# Patient Record
Sex: Male | Born: 1945
Health system: Southern US, Community
[De-identification: ages and names within clinical notes are randomized; demographics above are authoritative.]

## PROBLEM LIST (undated history)

## (undated) DIAGNOSIS — E079 Disorder of thyroid, unspecified: Secondary | ICD-10-CM

## (undated) DIAGNOSIS — I454 Nonspecific intraventricular block: Secondary | ICD-10-CM

## (undated) DIAGNOSIS — M1990 Unspecified osteoarthritis, unspecified site: Secondary | ICD-10-CM

## (undated) DIAGNOSIS — I2510 Atherosclerotic heart disease of native coronary artery without angina pectoris: Secondary | ICD-10-CM

## (undated) DIAGNOSIS — I714 Abdominal aortic aneurysm, without rupture, unspecified: Secondary | ICD-10-CM

## (undated) DIAGNOSIS — F419 Anxiety disorder, unspecified: Secondary | ICD-10-CM

## (undated) DIAGNOSIS — E669 Obesity, unspecified: Secondary | ICD-10-CM

## (undated) DIAGNOSIS — I509 Heart failure, unspecified: Secondary | ICD-10-CM

## (undated) DIAGNOSIS — R0602 Shortness of breath: Secondary | ICD-10-CM

## (undated) DIAGNOSIS — G4733 Obstructive sleep apnea (adult) (pediatric): Secondary | ICD-10-CM

## (undated) DIAGNOSIS — E119 Type 2 diabetes mellitus without complications: Secondary | ICD-10-CM

## (undated) DIAGNOSIS — R0609 Other forms of dyspnea: Secondary | ICD-10-CM

## (undated) DIAGNOSIS — G4730 Sleep apnea, unspecified: Secondary | ICD-10-CM

## (undated) DIAGNOSIS — R0601 Orthopnea: Secondary | ICD-10-CM

## (undated) DIAGNOSIS — Z9989 Dependence on other enabling machines and devices: Secondary | ICD-10-CM

## (undated) DIAGNOSIS — J449 Chronic obstructive pulmonary disease, unspecified: Secondary | ICD-10-CM

## (undated) DIAGNOSIS — R0600 Dyspnea, unspecified: Secondary | ICD-10-CM

## (undated) DIAGNOSIS — E785 Hyperlipidemia, unspecified: Secondary | ICD-10-CM

## (undated) DIAGNOSIS — I10 Essential (primary) hypertension: Secondary | ICD-10-CM

## (undated) DIAGNOSIS — F32A Depression, unspecified: Secondary | ICD-10-CM

## (undated) HISTORY — PX: VASCULAR SURGERY: SHX849

## (undated) HISTORY — DX: Orthopnea: R06.01

## (undated) HISTORY — DX: Essential (primary) hypertension: I10

## (undated) HISTORY — DX: Heart failure, unspecified: I50.9

## (undated) HISTORY — DX: Unspecified osteoarthritis, unspecified site: M19.90

## (undated) HISTORY — PX: VASECTOMY: SHX75

## (undated) HISTORY — DX: Chronic obstructive pulmonary disease, unspecified: J44.9

## (undated) HISTORY — DX: Anxiety disorder, unspecified: F41.9

## (undated) HISTORY — DX: Depression, unspecified: F32.A

## (undated) HISTORY — DX: Hyperlipidemia, unspecified: E78.5

## (undated) HISTORY — DX: Disorder of thyroid, unspecified: E07.9

## (undated) HISTORY — DX: Dyspnea, unspecified: R06.00

## (undated) HISTORY — DX: Other forms of dyspnea: R06.09

## (undated) HISTORY — PX: CORONARY ANGIOPLASTY WITH STENT PLACEMENT: SHX49

## (undated) HISTORY — DX: Type 2 diabetes mellitus without complications: E11.9

## (undated) HISTORY — DX: Obesity, unspecified: E66.9

## (undated) HISTORY — DX: Shortness of breath: R06.02

## (undated) HISTORY — DX: Abdominal aortic aneurysm, without rupture, unspecified: I71.40

## (undated) HISTORY — DX: Nonspecific intraventricular block: I45.4

## (undated) HISTORY — DX: Abdominal aortic aneurysm, without rupture: I71.4

## (undated) HISTORY — DX: Sleep apnea, unspecified: G47.30

## (undated) HISTORY — DX: Dependence on other enabling machines and devices: Z99.89

## (undated) HISTORY — DX: Atherosclerotic heart disease of native coronary artery without angina pectoris: I25.10

## (undated) HISTORY — DX: Obstructive sleep apnea (adult) (pediatric): G47.33

---

## 2016-04-28 DIAGNOSIS — E039 Hypothyroidism, unspecified: Secondary | ICD-10-CM | POA: Insufficient documentation

## 2016-06-19 DIAGNOSIS — Z9989 Dependence on other enabling machines and devices: Secondary | ICD-10-CM | POA: Insufficient documentation

## 2016-06-19 DIAGNOSIS — G4733 Obstructive sleep apnea (adult) (pediatric): Secondary | ICD-10-CM | POA: Insufficient documentation

## 2016-11-13 DIAGNOSIS — R0600 Dyspnea, unspecified: Secondary | ICD-10-CM | POA: Insufficient documentation

## 2016-11-13 DIAGNOSIS — J449 Chronic obstructive pulmonary disease, unspecified: Secondary | ICD-10-CM | POA: Insufficient documentation

## 2017-04-02 DIAGNOSIS — R918 Other nonspecific abnormal finding of lung field: Secondary | ICD-10-CM | POA: Insufficient documentation

## 2017-12-16 DIAGNOSIS — I878 Other specified disorders of veins: Secondary | ICD-10-CM | POA: Insufficient documentation

## 2017-12-16 DIAGNOSIS — F5102 Adjustment insomnia: Secondary | ICD-10-CM | POA: Insufficient documentation

## 2018-02-11 DIAGNOSIS — Z794 Long term (current) use of insulin: Secondary | ICD-10-CM | POA: Insufficient documentation

## 2018-02-11 DIAGNOSIS — B0229 Other postherpetic nervous system involvement: Secondary | ICD-10-CM | POA: Insufficient documentation

## 2018-02-11 DIAGNOSIS — E119 Type 2 diabetes mellitus without complications: Secondary | ICD-10-CM | POA: Insufficient documentation

## 2018-03-18 DIAGNOSIS — I714 Abdominal aortic aneurysm, without rupture, unspecified: Secondary | ICD-10-CM | POA: Insufficient documentation

## 2018-06-14 DIAGNOSIS — N528 Other male erectile dysfunction: Secondary | ICD-10-CM | POA: Insufficient documentation

## 2018-08-03 DIAGNOSIS — E1169 Type 2 diabetes mellitus with other specified complication: Secondary | ICD-10-CM | POA: Insufficient documentation

## 2018-08-03 DIAGNOSIS — E785 Hyperlipidemia, unspecified: Secondary | ICD-10-CM | POA: Insufficient documentation

## 2018-08-03 DIAGNOSIS — Z7901 Long term (current) use of anticoagulants: Secondary | ICD-10-CM | POA: Insufficient documentation

## 2019-09-29 DIAGNOSIS — Z6841 Body Mass Index (BMI) 40.0 and over, adult: Secondary | ICD-10-CM | POA: Insufficient documentation

## 2019-09-29 DIAGNOSIS — I5032 Chronic diastolic (congestive) heart failure: Secondary | ICD-10-CM | POA: Insufficient documentation

## 2020-03-15 ENCOUNTER — Encounter: Payer: Self-pay | Admitting: Internal Medicine

## 2020-03-15 ENCOUNTER — Other Ambulatory Visit: Payer: Self-pay

## 2020-03-15 ENCOUNTER — Ambulatory Visit (INDEPENDENT_AMBULATORY_CARE_PROVIDER_SITE_OTHER): Payer: Medicare PPO | Admitting: Internal Medicine

## 2020-03-15 VITALS — BP 108/70 | HR 60 | Ht 71.0 in | Wt 280.0 lb

## 2020-03-15 DIAGNOSIS — I5020 Unspecified systolic (congestive) heart failure: Secondary | ICD-10-CM | POA: Diagnosis not present

## 2020-03-15 DIAGNOSIS — E119 Type 2 diabetes mellitus without complications: Secondary | ICD-10-CM

## 2020-03-15 DIAGNOSIS — I2510 Atherosclerotic heart disease of native coronary artery without angina pectoris: Secondary | ICD-10-CM | POA: Diagnosis not present

## 2020-03-15 DIAGNOSIS — I4819 Other persistent atrial fibrillation: Secondary | ICD-10-CM | POA: Diagnosis not present

## 2020-03-15 DIAGNOSIS — I10 Essential (primary) hypertension: Secondary | ICD-10-CM

## 2020-03-15 MED ORDER — ENTRESTO 24-26 MG PO TABS: 1.0000 | ORAL_TABLET | Freq: Two times a day (BID) | ORAL | 6 refills | Status: DC

## 2020-03-15 NOTE — Patient Instructions (Signed)
Medication Instructions:  Your physician has recommended you make the following change in your medication:   1) Stop Diovan 2) Stop Diltiazem 3) Start Entresto 24-26 mg, 1 tablet by mouth twice a day  *If you need a refill on your cardiac medications before your next appointment, please call your pharmacy*  Lab Work: You will have labs drawn today: BMET/BNP/Magnesium  Testing/Procedures: Your physician has requested that you have an echocardiogram. Echocardiography is a painless test that uses sound waves to create images of your heart. It provides your doctor with information about the size and shape of your heart and how well your hearts chambers and valves are working. This procedure takes approximately one hour. There are no restrictions for this procedure.  Follow-Up: At [facility], you and your health needs are our priority.  As part of our continuing mission to provide you with exceptional heart care, we have created designated Provider Care Teams.  These Care Teams include your primary Cardiologist (physician) and Advanced Practice Providers (APPs -  Physician Assistants and Nurse Practitioners) who all work together to provide you with the care you need, when you need it.  Your next appointment:   1 month(s)  The format for your next appointment:   In Person  Provider:   Riley Lam, MD

## 2020-03-15 NOTE — Progress Notes (Signed)
Cardiology Office Note:    Date:  03/15/2020   ID:  Douglas Cowan, DOB 1945-12-04, MRN [id number]  [facility] Cardiologist:  No primary care provider on file.   CC: Establish Care Consulted for the evaluation of chest pain at the behest of the [place] [facility] [place]  History of Present Illness:    Douglas Cowan is a 74 y.o. male with a hx of Coronary Artery disease with prior stents in 2011, LCP 01/03/20 at [facility] with D2 80% prox LCx 60%.  Prior atrial flutter with ablation 8 years ago complicated by asystole per patient with long standing persistent atrial fibrillation CHADSVASC 4, question of HFrEF EF 35-40% with inferior WMA and severe LA dilation ([facility]) 01/02/20.   Diabetes, Morbid Obesity and some alcohol misuse.  Patient had profound shortness of breath with typical anginal chest pain in August of 2021.  Patient was found to have D2 disease with obstructive disease.  When he left the hospital he felt well without chest pain or shortness of breath.  Patient notes persistent weight loss.  No longer has chest pain, but has some shortness of breath with activity.  No palpitations.  Notes both PND and orthopnea.  Notes bendopnea.  Notes improves LE edema.  Past Medical History:  Diagnosis Date  . Abdominal aortic aneurysm (AAA) (HCC)   . BBB (bundle branch block)   . CAD (coronary artery disease)   . CHF (congestive heart failure) (HCC)   . CPAP (continuous positive airway pressure) dependence   . DM (diabetes mellitus) (HCC)   . DOE (dyspnea on exertion)   . HLD (hyperlipidemia)   . Hypertension   . Obesity   . Orthopnea   . OSA (obstructive sleep apnea)   . SOB (shortness of breath)     Past Surgical History:  Procedure Laterality Date  . CORONARY ANGIOPLASTY WITH STENT PLACEMENT      Current Medications: Current Meds  Medication Sig  . acetaminophen (TYLENOL) 500 MG tablet Take 500 mg by mouth every 6 (six) hours as needed.  Marland Kitchen apixaban  (ELIQUIS) 5 MG TABS tablet Take 5 mg by mouth 2 (two) times daily.  Marland Kitchen ascorbic acid (VITAMIN C) 500 MG tablet Take 500 mg by mouth daily.  Marland Kitchen aspirin EC 81 MG tablet Take 81 mg by mouth daily. Swallow whole.  . Dulaglutide (TRULICITY) 0.75 MG/0.5ML SOPN Inject 0.75 mg into the skin once a week.  . furosemide (LASIX) 40 MG tablet Take 40 mg by mouth.  . magnesium oxide (MAG-OX) 400 MG tablet Take 400 mg by mouth daily.  . metFORMIN (GLUCOPHAGE) 500 MG tablet Take by mouth 2 (two) times daily with a meal.  . metoprolol succinate (TOPROL-XL) 100 MG 24 hr tablet Take 100 mg by mouth in the morning and at bedtime. Take with or immediately following a meal.   . Multiple Vitamin (MULTIVITAMIN) capsule Take 1 capsule by mouth daily.  . pantoprazole (PROTONIX) 40 MG tablet Take 40 mg by mouth daily.  . potassium chloride (MICRO-K) 10 MEQ CR capsule Take 10 mEq by mouth daily.   . rosuvastatin (CRESTOR) 40 MG tablet Take 40 mg by mouth daily.  . tadalafil (CIALIS) 10 MG tablet Take 10 mg by mouth daily as needed for erectile dysfunction.  Marland Kitchen tiotropium (SPIRIVA) 18 MCG inhalation capsule Place 18 mcg into inhaler and inhale daily.  . [DISCONTINUED] diltiazem (TIAZAC) 120 MG 24 hr capsule Take 120 mg by mouth daily.  . [DISCONTINUED] valsartan (DIOVAN) 40  MG tablet Take 40 mg by mouth daily.  . [DISCONTINUED] zolpidem (AMBIEN) 10 MG tablet Take 10 mg by mouth at bedtime as needed for sleep.    Allergies:   Lipitor [atorvastatin]   Social History   Socioeconomic History  . Marital status: Married    Spouse name: Not on file  . Number of children: Not on file  . Years of education: Not on file  . Highest education level: Not on file  Occupational History  . Not on file  Tobacco Use  . Smoking status: Former Games developer  . Smokeless tobacco: Never Used  Substance and Sexual Activity  . Alcohol use: Not on file  . Drug use: Not on file  . Sexual activity: Not on file  Other Topics Concern  . Not on  file  Social History Narrative  . Not on file   Social Determinants of Health   Financial Resource Strain:   . Difficulty of Paying Living Expenses: Not on file  Food Insecurity:   . Worried About Programme researcher, broadcasting/film/video in the Last Year: Not on file  . Ran Out of Food in the Last Year: Not on file  Transportation Needs:   . Lack of Transportation (Medical): Not on file  . Lack of Transportation (Non-Medical): Not on file  Physical Activity:   . Days of Exercise per Week: Not on file  . Minutes of Exercise per Session: Not on file  Stress:   . Feeling of Stress : Not on file  Social Connections:   . Frequency of Communication with Friends and Family: Not on file  . Frequency of Social Gatherings with Friends and Family: Not on file  . Attends Religious Services: Not on file  . Active Member of Clubs or Organizations: Not on file  . Attends Banker Meetings: Not on file  . Marital Status: Not on file     Family History: The patient's notes heart disease in her family Sister had valve replacement with at age the age of 65 Brother had prior valve repair NOS  ROS:   Please see the history of present illness.    All other systems reviewed and are negative.  EKGs/Labs/Other Studies Reviewed:    The following studies were reviewed today:  EKG:   Atrial fibrillation rate 68 with RBBB  Old Records from [place] [facility] [place] August 2021. LCP 01/03/20 at [facility] with D2 80% prox LCx 60%.  Prior atrial flutter with ablation 8 years ago complicated by asystole per patient with long standing persistent atrial fibrillation CHADSVASC 4,, question of HFrEF EF 35-40% with inferior WMA and severe LA dilation ([facility] 01/02/20.   Physical Exam:    VS:  BP 108/70   Pulse 60   Ht 5\' 11"  (1.803 m)   Wt 280 lb (127 kg)   SpO2 99%   BMI 39.05 kg/m     Wt Readings from Last 3 Encounters:  03/15/20 280 lb (127 kg)    GEN:  Well developed in no  acute distress HEENT: Normal NECK: No JVD; No carotid bruits LYMPHATICS: No lymphadenopathy CARDIAC: RRR, no murmurs, rubs, gallops RESPIRATORY:  Clear to auscultation without rales, wheezing or rhonchi  ABDOMEN: Soft, non-tender, non-distended MUSCULOSKELETAL:  No edema; No deformity  SKIN: Warm and dry NEUROLOGIC:  Alert and oriented x 3 PSYCHIATRIC:  Normal affect   ASSESSMENT:    1. HFrEF (heart failure with reduced ejection fraction) (HCC)   2. Coronary artery disease  involving native coronary artery of native heart without angina pectoris   3. Persistent atrial fibrillation (HCC)   4. Morbid obesity (HCC)   5. Diabetes mellitus with coincident hypertension (HCC)    PLAN:    In order of problems listed above:  Chronic Decompensated Heart Failure  Morbid Obesity DM with HTN - NYHA class II, Stage B, slightly euvolemic, etiology from CAD vs AF mixed pictures - will continue lasix 40 mg PO daily - BMP, Mg, and BNP - will start Entresto and stop Diovan; will check Bmp mg in 7-10 days - Strict I/Os, daily weights, and fluid restriction of < 2 L  - Continue Toprol Xl 100 mg - at next visit will consider aldactone and or SGLTsi - Will get TTE - will address potassium after labs  Coronary Artery Disease; Obstructive in Dc and OM2 borderline - symptomatic mildly - continue ASA 81 mg with Eliquis until 12/2020 - continue statin, will check lipids - continue BB as above - will refer you to cardiac rehab - will get the old cath records  Long Standing, Persistent Atrial Fibrillation CHADSVASC 4  - BB and AC as above -  Will stop that diltiazem or tiazac  - at some point OSA  - will reduce to one alcohol drink a week   One month  follow up unless new symptoms or abnormal test results warranting change in plan  Would be reasonable for APP Follow up   Medication Adjustments/Labs and Tests Ordered: Current medicines are reviewed at length with the patient today.  Concerns  regarding medicines are outlined above.  Orders Placed This Encounter  Procedures  . Basic metabolic panel  . Pro b natriuretic peptide (BNP)  . Magnesium  . ECHOCARDIOGRAM COMPLETE   Meds ordered this encounter  Medications  . sacubitril-valsartan (ENTRESTO) 24-26 MG    Sig: Take 1 tablet by mouth 2 (two) times daily.    Dispense:  60 tablet    Refill:  6    Patient Instructions  Medication Instructions:  Your physician has recommended you make the following change in your medication:   1) Stop Diovan 2) Stop Diltiazem 3) Start Entresto 24-26 mg, 1 tablet by mouth twice a day  *If you need a refill on your cardiac medications before your next appointment, please call your pharmacy*  Lab Work: You will have labs drawn today: BMET/BNP/Magnesium  Testing/Procedures: Your physician has requested that you have an echocardiogram. Echocardiography is a painless test that uses sound waves to create images of your heart. It provides your doctor with information about the size and shape of your heart and how well your heart's chambers and valves are working. This procedure takes approximately one hour. There are no restrictions for this procedure.  Follow-Up: At [facility], you and your health needs are our priority.  As part of our continuing mission to provide you with exceptional heart care, we have created designated Provider Care Teams.  These Care Teams include your primary Cardiologist (physician) and Advanced Practice Providers (APPs -  Physician Assistants and Nurse Practitioners) who all work together to provide you with the care you need, when you need it.  Your next appointment:   1 month(s)  The format for your next appointment:   In Person  Provider:   Riley Lam, MD      Signed, Christell Constant, MD  03/15/2020 12:26 PM    [facility]

## 2020-03-16 LAB — BASIC METABOLIC PANEL
BUN/Creatinine Ratio: 16 (ref 10–24)
BUN: 17 mg/dL (ref 8–27)
CO2: 25 mmol/L (ref 20–29)
Calcium: 9.7 mg/dL (ref 8.6–10.2)
Chloride: 100 mmol/L (ref 96–106)
Creatinine, Ser: 1.04 mg/dL (ref 0.76–1.27)
GFR calc Af Amer: 83 mL/min/{1.73_m2} (ref >59)
GFR calc non Af Amer: 71 mL/min/{1.73_m2} (ref >59)
Glucose: 171 mg/dL — ABNORMAL HIGH (ref 65–99)
Potassium: 4.3 mmol/L (ref 3.5–5.2)
Sodium: 140 mmol/L (ref 134–144)

## 2020-03-16 LAB — MAGNESIUM: Magnesium: 1.7 mg/dL (ref 1.6–2.3)

## 2020-03-16 LAB — PRO B NATRIURETIC PEPTIDE: NT-Pro BNP: 256 pg/mL (ref 0–376)

## 2020-03-27 ENCOUNTER — Telehealth: Payer: Self-pay | Admitting: Internal Medicine

## 2020-03-27 NOTE — Telephone Encounter (Signed)
Reviewed [facility] LCP and Echo- has 80% ostial D2 lesion, medium sized lesion.  EF 30-35% moderate LA.  Will review with interventional colleagues.  Diabetic patient but without obstructive multi-vessel disease.  Will discuss repeat LCP depending on sx at next visit; revascularization may improve EF but would likely not lead to full recovery.

## 2020-03-28 ENCOUNTER — Ambulatory Visit (INDEPENDENT_AMBULATORY_CARE_PROVIDER_SITE_OTHER): Payer: Medicare PPO | Admitting: Cardiovascular Disease

## 2020-03-28 ENCOUNTER — Encounter: Payer: Self-pay | Admitting: Cardiovascular Disease

## 2020-03-28 VITALS — BP 92/58 | HR 71 | Ht 71.0 in | Wt 280.0 lb

## 2020-03-28 DIAGNOSIS — I4819 Other persistent atrial fibrillation: Secondary | ICD-10-CM | POA: Diagnosis not present

## 2020-03-28 NOTE — Progress Notes (Signed)
Chief Complaint  Patient presents with  . Follow-up    CAD, chest pain    History of Present Illness: 74 yo male with history of CAD, atrial flutter s/p ablation now with long standing persistent atrial fibrillation, ischemic cardiomyopathy, DM, obesity who is added onto my schedule today after he walked into our office asking to review his medications with triage nursing. He then reported chest pain on/off for several days. He was recently seen 03/15/20 in our office by Dr. Izora Ribas to establish care and reported having had a cardiac cath at [facility] in August 2021 with cath showing 80% Diagonal stenosis and 60% Circumflex stenosis. He was started on Entresto at his first visit here last month. He has had low blood pressures since starting the Entresto. His blood pressure has been in the 90s systolic at home. 98/64 yesterday, 98/66 this am. He has also noticed mild substernal chest pains that last several minutes. Similar to pain he had prior to his cardiac cath in August 2021. No associated dyspnea, diaphoresis or N/V. He denies any chest pain currently.   Primary Care Physician: Patient, No Pcp Per   Past Medical History:  Diagnosis Date  . Abdominal aortic aneurysm (AAA) (HCC)   . BBB (bundle branch block)   . CAD (coronary artery disease)   . CHF (congestive heart failure) (HCC)   . CPAP (continuous positive airway pressure) dependence   . DM (diabetes mellitus) (HCC)   . DOE (dyspnea on exertion)   . HLD (hyperlipidemia)   . Hypertension   . Obesity   . Orthopnea   . OSA (obstructive sleep apnea)   . SOB (shortness of breath)     Past Surgical History:  Procedure Laterality Date  . CORONARY ANGIOPLASTY WITH STENT PLACEMENT      Current Outpatient Medications  Medication Sig Dispense Refill  . acetaminophen (TYLENOL) 500 MG tablet Take 500 mg by mouth every 6 (six) hours as needed.    Marland Kitchen apixaban (ELIQUIS) 5 MG TABS tablet Take 5 mg by mouth 2 (two) times  daily.    Marland Kitchen ascorbic acid (VITAMIN C) 500 MG tablet Take 500 mg by mouth daily.    Marland Kitchen aspirin EC 81 MG tablet Take 81 mg by mouth daily. Swallow whole.    . furosemide (LASIX) 40 MG tablet Take 40 mg by mouth.    . magnesium oxide (MAG-OX) 400 MG tablet Take 400 mg by mouth daily.    . metFORMIN (GLUCOPHAGE) 500 MG tablet Take by mouth 2 (two) times daily with a meal.    . metoprolol succinate (TOPROL-XL) 100 MG 24 hr tablet Take 100 mg by mouth in the morning and at bedtime. Take with or immediately following a meal.     . Multiple Vitamin (MULTIVITAMIN) capsule Take 1 capsule by mouth daily.    . pantoprazole (PROTONIX) 40 MG tablet Take 40 mg by mouth daily.    . potassium chloride (MICRO-K) 10 MEQ CR capsule Take 10 mEq by mouth daily.     . rosuvastatin (CRESTOR) 40 MG tablet Take 40 mg by mouth daily.    . tadalafil (CIALIS) 10 MG tablet Take 10 mg by mouth daily as needed for erectile dysfunction.    Marland Kitchen tiotropium (SPIRIVA) 18 MCG inhalation capsule Place 18 mcg into inhaler and inhale daily.     No current facility-administered medications for this visit.    Allergies  Allergen Reactions  . Lipitor [Atorvastatin]     Social History  Socioeconomic History  . Marital status: Married    Spouse name: Not on file  . Number of children: Not on file  . Years of education: Not on file  . Highest education level: Not on file  Occupational History  . Not on file  Tobacco Use  . Smoking status: Former Games developer  . Smokeless tobacco: Never Used  Substance and Sexual Activity  . Alcohol use: Not on file  . Drug use: Not on file  . Sexual activity: Not on file  Other Topics Concern  . Not on file  Social History Narrative  . Not on file   Social Determinants of Health   Financial Resource Strain:   . Difficulty of Paying Living Expenses: Not on file  Food Insecurity:   . Worried About Programme researcher, broadcasting/film/video in the Last Year: Not on file  . Ran Out of Food in the Last Year: Not on  file  Transportation Needs:   . Lack of Transportation (Medical): Not on file  . Lack of Transportation (Non-Medical): Not on file  Physical Activity:   . Days of Exercise per Week: Not on file  . Minutes of Exercise per Session: Not on file  Stress:   . Feeling of Stress : Not on file  Social Connections:   . Frequency of Communication with Friends and Family: Not on file  . Frequency of Social Gatherings with Friends and Family: Not on file  . Attends Religious Services: Not on file  . Active Member of Clubs or Organizations: Not on file  . Attends Banker Meetings: Not on file  . Marital Status: Not on file  Intimate Partner Violence:   . Fear of Current or Ex-Partner: Not on file  . Emotionally Abused: Not on file  . Physically Abused: Not on file  . Sexually Abused: Not on file    History reviewed. No pertinent family history.  Review of Systems:  As stated in the HPI and otherwise negative.   BP (!) 92/58   Pulse 71   Ht 5\' 11"  (1.803 m)   Wt 280 lb (127 kg)   SpO2 94%   BMI 39.05 kg/m   Physical Examination: General: Well developed, well nourished, NAD  HEENT: OP clear, mucus membranes moist  SKIN: warm, dry. No rashes. Neuro: No focal deficits  Musculoskeletal: Muscle strength 5/5 all ext  Psychiatric: Mood and affect normal  Neck: No JVD, no carotid bruits, no thyromegaly, no lymphadenopathy.  Lungs:Clear bilaterally, no wheezes, rhonci, crackles Cardiovascular: Irreg irreg. No murmurs, gallops or rubs. Abdomen:Soft. Bowel sounds present. Non-tender.  Extremities: No lower extremity edema. Pulses are 2 + in the bilateral DP/PT.  EKG:  EKG is ordered today. The ekg ordered today demonstrates atrial fibrillation, rate 71 bpm. No ischemic changes.   Recent Labs: 03/15/2020: BUN 17; Creatinine, Ser 1.04; Magnesium 1.7; NT-Pro BNP 256; Potassium 4.3; Sodium 140   Lipid Panel No results found for: CHOL, TRIG, HDL, CHOLHDL, VLDL, LDLCALC,  LDLDIRECT   Wt Readings from Last 3 Encounters:  03/28/20 280 lb (127 kg)  03/15/20 280 lb (127 kg)      Assessment and Plan:   1. CAD with angina: He is having mild occasional chest pains, similar to his prior angina. No ischemic EKG changes today. No active chest pain. He is not on a long acting nitrate. I reviewed the findings from the cardiac cath in August 2021 at [place] [facility] [place]. He has no obstructive disease in the major  epicardial vessels. His blood pressure has been low since starting Entresto. I cannot add a long acting nitrate to his therapy today given his soft BP. Will have him hold Entresto for now as I suspect his hypotension is contributing to his fatigue and chest pressure. Follow BP closely at home over the next few days. He will be followed closely in our office. It may be best to resume his ARB instead of Entresto. He will call 911 with a change in intensity or character of his chest pain.   2. Persistent atrial fibrillation: Rate controlled. Continue Eliquis and Toprol.   Current medicines are reviewed at length with the patient today.  The patient does not have concerns regarding medicines.  The following changes have been made:  no change  Labs/ tests ordered today include:   Orders Placed This Encounter  Procedures  . EKG 12-Lead     Disposition:   FU with Dr. Izora Ribas or office APP in one week.    Signed, Verne Carrow, MD 03/28/2020 1:24 PM    [facility] [address] [place], [place], [place]  [address] Phone: [phone]; Fax: [phone]

## 2020-03-28 NOTE — Patient Instructions (Addendum)
Medication Instructions:  Stop ENTRESTO *If you need a refill on your cardiac medications before your next appointment, please call your pharmacy*   Lab Work: none If you have labs (blood work) drawn today and your tests are completely normal, you will receive your results only by: Marland Kitchen MyChart Message (if you have MyChart) OR . A paper copy in the mail If you have any lab test that is abnormal or we need to change your treatment, we will call you to review the results.   Testing/Procedures: none   Follow-Up: As scheduled 04/16/20.   Other Instructions

## 2020-04-09 ENCOUNTER — Other Ambulatory Visit (HOSPITAL_COMMUNITY): Payer: Medicare PPO

## 2020-04-09 ENCOUNTER — Encounter (HOSPITAL_COMMUNITY): Payer: Self-pay | Admitting: Internal Medicine

## 2020-04-11 ENCOUNTER — Encounter: Payer: Self-pay | Admitting: Physician Assistant

## 2020-04-11 ENCOUNTER — Other Ambulatory Visit: Payer: Self-pay

## 2020-04-11 ENCOUNTER — Ambulatory Visit (INDEPENDENT_AMBULATORY_CARE_PROVIDER_SITE_OTHER): Payer: Medicare PPO | Admitting: Physician Assistant

## 2020-04-11 VITALS — BP 118/80 | HR 78 | Temp 98.2°F | Resp 16 | Ht 70.0 in | Wt 283.0 lb

## 2020-04-11 DIAGNOSIS — E039 Hypothyroidism, unspecified: Secondary | ICD-10-CM

## 2020-04-11 DIAGNOSIS — E785 Hyperlipidemia, unspecified: Secondary | ICD-10-CM | POA: Diagnosis not present

## 2020-04-11 DIAGNOSIS — Z794 Long term (current) use of insulin: Secondary | ICD-10-CM | POA: Diagnosis not present

## 2020-04-11 DIAGNOSIS — E119 Type 2 diabetes mellitus without complications: Secondary | ICD-10-CM | POA: Diagnosis not present

## 2020-04-11 DIAGNOSIS — E1169 Type 2 diabetes mellitus with other specified complication: Secondary | ICD-10-CM

## 2020-04-11 DIAGNOSIS — R7989 Other specified abnormal findings of blood chemistry: Secondary | ICD-10-CM | POA: Diagnosis not present

## 2020-04-11 DIAGNOSIS — J42 Unspecified chronic bronchitis: Secondary | ICD-10-CM

## 2020-04-11 DIAGNOSIS — I5020 Unspecified systolic (congestive) heart failure: Secondary | ICD-10-CM | POA: Diagnosis not present

## 2020-04-11 DIAGNOSIS — Z125 Encounter for screening for malignant neoplasm of prostate: Secondary | ICD-10-CM

## 2020-04-11 DIAGNOSIS — I4819 Other persistent atrial fibrillation: Secondary | ICD-10-CM | POA: Diagnosis not present

## 2020-04-11 DIAGNOSIS — I10 Essential (primary) hypertension: Secondary | ICD-10-CM

## 2020-04-11 LAB — LIPID PANEL
Cholesterol: 135 mg/dL (ref 0–200)
HDL: 41.5 mg/dL (ref >39.00)
LDL Cholesterol: 58 mg/dL (ref 0–99)
NonHDL: 93.39
Total CHOL/HDL Ratio: 3
Triglycerides: 178 mg/dL — ABNORMAL HIGH (ref 0.0–149.0)
VLDL: 35.6 mg/dL (ref 0.0–40.0)

## 2020-04-11 LAB — HEMOGLOBIN A1C: Hgb A1c MFr Bld: 8.5 % — ABNORMAL HIGH (ref 4.6–6.5)

## 2020-04-11 LAB — COMPREHENSIVE METABOLIC PANEL
ALT: 49 U/L (ref 0–53)
AST: 47 U/L — ABNORMAL HIGH (ref 0–37)
Albumin: 4.4 g/dL (ref 3.5–5.2)
Alkaline Phosphatase: 41 U/L (ref 39–117)
BUN: 17 mg/dL (ref 6–23)
CO2: 28 mEq/L (ref 19–32)
Calcium: 9.6 mg/dL (ref 8.4–10.5)
Chloride: 100 mEq/L (ref 96–112)
Creatinine, Ser: 1.06 mg/dL (ref 0.40–1.50)
GFR: 70.31 mL/min (ref >60.00)
Glucose, Bld: 245 mg/dL — ABNORMAL HIGH (ref 70–99)
Potassium: 4.3 mEq/L (ref 3.5–5.1)
Sodium: 138 mEq/L (ref 135–145)
Total Bilirubin: 0.8 mg/dL (ref 0.2–1.2)
Total Protein: 6.9 g/dL (ref 6.0–8.3)

## 2020-04-11 LAB — PSA, MEDICARE: PSA: 0.55 ng/ml (ref 0.10–4.00)

## 2020-04-11 LAB — TSH: TSH: 6.35 u[IU]/mL — ABNORMAL HIGH (ref 0.35–4.50)

## 2020-04-11 LAB — MICROALBUMIN / CREATININE URINE RATIO
Creatinine,U: 25.3 mg/dL
Microalb Creat Ratio: 2.8 mg/g (ref 0.0–30.0)
Microalb, Ur: <0.7 mg/dL (ref 0.0–1.9)

## 2020-04-11 LAB — TESTOSTERONE: Testosterone: 163.48 ng/dL — ABNORMAL LOW (ref 300.00–890.00)

## 2020-04-11 NOTE — Progress Notes (Signed)
Patient presents to clinic today to establish care.  Patient and wife recently moved to the area from [place].  Patient has a history of coronary artery disease, persistent A. fib, CHF, hyperlipidemia, OSA, COPD, diabetes mellitus with hypertension, anxiety and history of abdominal aortic aneurysm.  Is currently followed by local cardiologist Dr. Angelena Form with recent visit on 03/28/2020.  Is currently on a regimen of Eliquis 5 mg twice daily, 81 mg ASA daily, metoprolol XL 100 mg twice daily, Entresto 24-26 mg twice daily, Lasix 40 mg daily, Klor-Con 10 mEq daily and Crestor 40 mg daily.  Endorses taking all medications as directed.  Denies any current issue with chest pain, lightheadedness or dizziness.  Has had some mild shortness of breath so cardiology has ordered an echocardiogram which is scheduled for the first of next month.  In regards to diabetes, patient notes recently being switched from [place] to Trulicity by his previous primary care provider.  Was taking 0.75 mg weekly as directed.  Ran out of pansinusitis restarted his Basaglar at 10 units nightly.  Notes fasting glucose levels greater than 180 at present.  Is continued on Metformin 1000 mg twice daily.  Denies any known history of diabetic retinopathy, nephropathy or neuropathy.  Unsure of vaccine status.  Records will need to be requested.  Of note patient endorses being told his thyroid was underactive.  Was on medication for some time but notes has not been on in several years and has been told thyroid function is stable.  Would like this rechecked.  Records requested to see when last health maintenance was.  Past Medical History:  Diagnosis Date   Abdominal aortic aneurysm (AAA) (HCC)    Anxiety    Arthritis    BBB (bundle branch block)    CAD (coronary artery disease)    CHF (congestive heart failure) (HCC)    COPD (chronic obstructive pulmonary disease) (HCC)    CPAP (continuous positive airway pressure)  dependence    Depression    DM (diabetes mellitus) ([place])    DOE (dyspnea on exertion)    HLD (hyperlipidemia)    Hypertension    Obesity    Orthopnea    OSA (obstructive sleep apnea)    Sleep apnea    SOB (shortness of breath)    Thyroid disease     Past Surgical History:  Procedure Laterality Date   CORONARY ANGIOPLASTY WITH STENT PLACEMENT     VASECTOMY      Current Outpatient Medications on File Prior to Visit  Medication Sig Dispense Refill   ACCU-CHEK GUIDE test strip      Accu-Chek Softclix Lancets lancets 1 each 3 (three) times daily.     acetaminophen (TYLENOL) 500 MG tablet Take 500 mg by mouth every 6 (six) hours as needed.     albuterol (VENTOLIN HFA) 108 (90 Base) MCG/ACT inhaler Inhale into the lungs.     apixaban (ELIQUIS) 5 MG TABS tablet Take 5 mg by mouth 2 (two) times daily.     ascorbic acid (VITAMIN C) 500 MG tablet Take 500 mg by mouth daily.     aspirin EC 81 MG tablet Take 81 mg by mouth daily. Swallow whole.     Dulaglutide (TRULICITY) 3 YK/5.9DJ SOPN Inject into the skin.     furosemide (LASIX) 40 MG tablet Take 40 mg by mouth.     Insulin Glargine (BASAGLAR KWIKPEN) 100 UNIT/ML Inject 10 Units into the skin at bedtime.     magnesium oxide (MAG-OX)  400 MG tablet Take 400 mg by mouth daily.     metFORMIN (GLUCOPHAGE) 500 MG tablet Take by mouth 2 (two) times daily with a meal.     metoprolol succinate (TOPROL-XL) 100 MG 24 hr tablet Take 100 mg by mouth in the morning and at bedtime. Take with or immediately following a meal.      Multiple Vitamin (MULTIVITAMIN) capsule Take 1 capsule by mouth daily.     pantoprazole (PROTONIX) 40 MG tablet Take 40 mg by mouth daily.     potassium chloride (MICRO-K) 10 MEQ CR capsule Take 10 mEq by mouth daily.      rosuvastatin (CRESTOR) 40 MG tablet Take 40 mg by mouth daily.     sacubitril-valsartan (ENTRESTO) 24-26 MG Take 1 tablet by mouth 2 (two) times daily.     tadalafil (CIALIS)  10 MG tablet Take 10 mg by mouth daily as needed for erectile dysfunction.     tiotropium (SPIRIVA) 18 MCG inhalation capsule Place 18 mcg into inhaler and inhale daily.     No current facility-administered medications on file prior to visit.    Allergies  Allergen Reactions   Lipitor [Atorvastatin] Other (See Comments)    Myalgias, flu like symptoms    Family History  Problem Relation Age of Onset   Depression Mother    Heart disease Mother    Hyperlipidemia Mother    Stroke Mother    Cancer Father    Diabetes Father    Early death Father    Arthritis Sister    Birth defects Sister    Heart disease Sister    Early death Brother    Birth defects Brother    Heart disease Brother     Social History   Socioeconomic History   Marital status: Married    Spouse name: Not on file   Number of children: Not on file   Years of education: Not on file   Highest education level: Not on file  Occupational History   Not on file  Tobacco Use   Smoking status: Former Smoker    Types: Cigarettes, Pipe   Smokeless tobacco: Never Used  Scientific laboratory technician Use: Never used  Substance and Sexual Activity   Alcohol use: Yes    Alcohol/week: 20.0 standard drinks    Types: 6 Cans of beer, 14 Glasses of wine per week   Drug use: Never   Sexual activity: Yes    Birth control/protection: Surgical    Comment: Vascetomy  Other Topics Concern   Not on file  Social History Narrative   Not on file   Social Determinants of Health   Financial Resource Strain:    Difficulty of Paying Living Expenses: Not on file  Food Insecurity:    Worried About Charity fundraiser in the Last Year: Not on file   [employer] of Food in the Last Year: Not on file  Transportation Needs:    Lack of Transportation (Medical): Not on file   Lack of Transportation (Non-Medical): Not on file  Physical Activity:    Days of Exercise per Week: Not on file   Minutes of Exercise  per Session: Not on file  Stress:    Feeling of Stress : Not on file  Social Connections:    Frequency of Communication with Friends and Family: Not on file   Frequency of Social Gatherings with Friends and Family: Not on file   Attends Religious Services: Not on file  Active Member of Clubs or Organizations: Not on file   Attends Archivist Meetings: Not on file   Marital Status: Not on file  Intimate Partner Violence:    Fear of Current or Ex-Partner: Not on file   Emotionally Abused: Not on file   Physically Abused: Not on file   Sexually Abused: Not on file   ROS Pertinent ROS are listed in the HPI.  Resp 16    Ht 5' 10" (1.778 m)    Wt 283 lb (128.4 kg)    BMI 40.61 kg/m   Physical Exam Vitals reviewed.  Constitutional:      Appearance: Normal appearance.  HENT:     Head: Normocephalic and atraumatic.     Right Ear: Tympanic membrane normal.     Left Ear: Tympanic membrane normal.  Eyes:     Conjunctiva/sclera: Conjunctivae normal.     Pupils: Pupils are equal, round, and reactive to light.  Cardiovascular:     Rate and Rhythm: Rhythm irregular.     Pulses: Normal pulses.     Heart sounds: Normal heart sounds.     Comments: Chronic irregularly irregular rhythm with A. fib.  Rate controlled. Pulmonary:     Effort: Pulmonary effort is normal.     Breath sounds: Normal breath sounds.  Musculoskeletal:     Cervical back: Neck supple.  Neurological:     General: No focal deficit present.     Mental Status: He is alert and oriented to person, place, and time.  Psychiatric:        Mood and Affect: Mood normal.     Recent Results (from the past 2160 hour(s))  Basic metabolic panel     Status: Abnormal   Collection Time: 03/15/20 12:27 PM  Result Value Ref Range   Glucose 171 (H) 65 - 99 mg/dL   BUN 17 8 - 27 mg/dL   Creatinine, Ser 1.04 0.76 - 1.27 mg/dL   GFR calc non Af Amer 71 >59 mL/min/1.73   GFR calc Af Amer 83 >59 mL/min/1.73     Comment: **In accordance with recommendations from the NKF-ASN Task force,**   [facility] is in the process of updating its eGFR calculation to the   2021 CKD-EPI creatinine equation that estimates kidney function   without a race variable.    BUN/Creatinine Ratio 16 10 - 24   Sodium 140 134 - 144 mmol/L   Potassium 4.3 3.5 - 5.2 mmol/L   Chloride 100 96 - 106 mmol/L   CO2 25 20 - 29 mmol/L   Calcium 9.7 8.6 - 10.2 mg/dL  Pro b natriuretic peptide (BNP)     Status: None   Collection Time: 03/15/20 12:27 PM  Result Value Ref Range   NT-Pro BNP 256 0 - 376 pg/mL    Comment: The following cut-points have been suggested for the use of proBNP for the diagnostic evaluation of heart failure (HF) in patients with acute dyspnea: Modality                     Age           Optimal Cut                            (years)            Point ------------------------------------------------------ Diagnosis (rule in HF)        <50  450 pg/mL                           50 - 75            900 pg/mL                               >75           1800 pg/mL Exclusion (rule out HF)  Age independent     300 pg/mL   Magnesium     Status: None   Collection Time: 03/15/20 12:27 PM  Result Value Ref Range   Magnesium 1.7 1.6 - 2.3 mg/dL    Assessment/Plan: 1. Type 2 diabetes mellitus without complication, with long-term current use of insulin (HCC) We will repeat labs today to assess current glycemic control.  Plan to restart Trulicity but likely at 1.5 mg weekly dose with plan to further titrate as tolerated.  Continue Metformin twice daily.  Records requested to assess immunization status.  Also get records from last eye examination.  Will obtain labs today to include CMP, A1c, lipid and urine microalbumin/creatinine ratio. - Comprehensive metabolic panel - Hemoglobin A1c - Lipid panel - Urine Microalbumin w/creat. ratio  2. Diabetes mellitus with coincident hypertension ([place]) 3. Hyperlipidemia  associated with type 2 diabetes mellitus (HCC) BP normotensive today.  Asymptomatic.  Take medications as directed.  Follow-up with cardiology as scheduled.  Repeat fasting labs today. - Hemoglobin A1c - Lipid panel  4. HFrEF (heart failure with reduced ejection fraction) (HCC) 5. Persistent atrial fibrillation (HCC) Taking medications as directed.  Euvolemic on examination today.  A. fib noted on examination with rate control.  Continue management per cardiology.  6. Chronic bronchitis, unspecified chronic bronchitis type ([place]) Stable.  Continue current medication regimen.  7. Acquired hypothyroidism Patient notes prior history.  We will repeat TSH level today to assess current thyroid status. - TSH  8. Low testosterone Mention at end of visit.  Patient would like his testosterone levels checked as he has history of testosterone deficiency on replacement previously.  Discussed with patient that I am happy to check low levels but given his cardiac history he would need to see a specialist to discuss replacement.  He voiced understanding and would like to proceed with labs. - Testosterone - PSA, Medicare  9. Prostate cancer screening - PSA, Medicare  This visit occurred during the SARS-CoV-2 public health emergency.  Safety protocols were in place, including screening questions prior to the visit, additional usage of staff PPE, and extensive cleaning of exam room while observing appropriate contact time as indicated for disinfecting solutions.    Leeanne Rio, PA-C

## 2020-04-11 NOTE — Patient Instructions (Addendum)
Please go to the lab today for blood work.  I will call you with your results. We will alter treatment regimen(s) if indicated by your results.   Please stop the Basaglar and restart the Trulicity once I call with labs. We will likely be increasing the dose of medication.   I have sent in refills of your other medications.  Please follow-up with Cardiology as scheduled.   It was very nice meeting you today. Welcome to [facility]!

## 2020-04-12 ENCOUNTER — Other Ambulatory Visit: Payer: Self-pay | Admitting: Physician Assistant

## 2020-04-12 MED ORDER — TRULICITY 1.5 MG/0.5ML ~~LOC~~ SOAJ: 1.5000 mg | SUBCUTANEOUS | 1 refills | Status: DC

## 2020-04-16 ENCOUNTER — Ambulatory Visit: Payer: Medicare PPO | Admitting: Internal Medicine

## 2020-04-16 ENCOUNTER — Encounter: Payer: Self-pay | Admitting: Internal Medicine

## 2020-04-16 ENCOUNTER — Other Ambulatory Visit: Payer: Self-pay

## 2020-04-16 VITALS — BP 120/72 | HR 62 | Ht 70.0 in | Wt 280.0 lb

## 2020-04-16 DIAGNOSIS — I4819 Other persistent atrial fibrillation: Secondary | ICD-10-CM | POA: Diagnosis not present

## 2020-04-16 DIAGNOSIS — I2510 Atherosclerotic heart disease of native coronary artery without angina pectoris: Secondary | ICD-10-CM

## 2020-04-16 DIAGNOSIS — I10 Essential (primary) hypertension: Secondary | ICD-10-CM

## 2020-04-16 DIAGNOSIS — E119 Type 2 diabetes mellitus without complications: Secondary | ICD-10-CM

## 2020-04-16 DIAGNOSIS — I5020 Unspecified systolic (congestive) heart failure: Secondary | ICD-10-CM

## 2020-04-16 DIAGNOSIS — R400 Somnolence: Secondary | ICD-10-CM | POA: Insufficient documentation

## 2020-04-16 LAB — BASIC METABOLIC PANEL
BUN/Creatinine Ratio: 17 (ref 10–24)
BUN: 19 mg/dL (ref 8–27)
CO2: 21 mmol/L (ref 20–29)
Calcium: 9.6 mg/dL (ref 8.6–10.2)
Chloride: 101 mmol/L (ref 96–106)
Creatinine, Ser: 1.1 mg/dL (ref 0.76–1.27)
GFR calc Af Amer: 77 mL/min/{1.73_m2} (ref >59)
GFR calc non Af Amer: 67 mL/min/{1.73_m2} (ref >59)
Glucose: 340 mg/dL — ABNORMAL HIGH (ref 65–99)
Potassium: 4.7 mmol/L (ref 3.5–5.2)
Sodium: 138 mmol/L (ref 134–144)

## 2020-04-16 LAB — MAGNESIUM: Magnesium: 1.9 mg/dL (ref 1.6–2.3)

## 2020-04-16 NOTE — Patient Instructions (Addendum)
Medication Instructions:  Your physician has recommended you make the following change in your medication:   STOP: aspirin  *If you need a refill on your cardiac medications before your next appointment, please call your pharmacy*   Lab Work: TODAY: BMET, [facility]  Your physician recommends that you return for lab work on 05/04/20 for BMET, CBC   If you have labs (blood work) drawn today and your tests are completely normal, you will receive your results only by: Marland Kitchen MyChart Message (if you have MyChart) OR . A paper copy in the mail If you have any lab test that is abnormal or we need to change your treatment, we will call you to review the results.   Testing/Procedures: Your physician has recommended that you have a Cardioversion (DCCV). Electrical Cardioversion uses a jolt of electricity to your heart either through paddles or wired patches attached to your chest. This is a controlled, usually prescheduled, procedure. Defibrillation is done under light anesthesia in the hospital, and you usually go home the day of the procedure. This is done to get your heart back into a normal rhythm. You are not awake for the procedure. Please see the instruction sheet given to you today.  Your physician has recommended that you have a sleep study. This test records several body functions during sleep, including: brain activity, eye movement, oxygen and carbon dioxide blood levels, heart rate and rhythm, breathing rate and rhythm, the flow of air through your mouth and nose, snoring, body muscle movements, and chest and belly movement.   Follow-Up: EKG Nurse visit scheduled for 05/04/20 at 9:00 AM (the same day as your echocardiogram).  Follow up with Dr. Izora Ribas on 06/15/20 at 9:00 AM.  Other Instructions  You are scheduled for a Cardioversion on 05/17/20 with Dr. Izora Ribas.  Please arrive at the [facility] (Main Entrance A) at [facility]: [address] [place], [place] [address] at  9:00 am. (1 hour prior to procedure)  DIET: Nothing to eat or drink after midnight except a sip of water with medications (see medication instructions below)  Medication Instructions: Hold Furosemide and metformin the morning of the procedure  Continue your anticoagulant: Eliquis You will need to continue your anticoagulant after your procedure until you are told by your Provider that it is safe to stop   Labs: CBC/BMET on 05/04/20  Due to recent COVID-19 restrictions implemented by our local and state authorities and in an effort to keep both patients and staff as safe as possible, our hospital system requires COVID-19 testing prior to certain scheduled hospital procedures.  Please go to [address] [place], [place] [address] on 05/14/20 at 3:05 PM.  This is a drive up testing site.  You will not need to exit your vehicle.  You will not be billed at the time of testing but may receive a bill later depending on your insurance. You must agree to self-quarantine from the time of your testing until the procedure date on 05/17/20.  This should included staying home with ONLY the people you live with.  Avoid take-out, grocery store shopping or leaving the house for any non-emergent reason.  Failure to have your COVID-19 test done on the date and time you have been scheduled will result in cancellation of your procedure.  Please call our office at [phone] if you have any questions.   You must have a responsible person to drive you home and stay in the waiting area during your procedure. Failure to  do so could result in cancellation.  Bring your insurance cards.  *Special Note: Every effort is made to have your procedure done on time. Occasionally there are emergencies that occur at the hospital that may cause delays. Please be patient if a delay does occur.

## 2020-04-16 NOTE — Progress Notes (Signed)
Cardiology Office Note:    Date:  04/16/2020   ID:  Douglas Cowan, DOB [birth year], MRN [id number]  [facility] Cardiologist:  Marcelline Mates PA-C  CC: Follow up CP  History of Present Illness:    Douglas Cowan is a 74 y.o. male with a hx of CAD with prior stents in 2011, LCP 01/03/20 at [facility] with D2 80% (bifurcation lesion) prox LCx 60%.  Prior atrial flutter with ablation 8 years ago complicated by asystole per patient with long standing persistent atrial fibrillation CHADSVASC 4, question of HFrEF EF 35-40% with inferior WMA and severe LA dilation ([facility]) 01/02/20.   Diabetes, Morbid Obesity.  Presented to establish care 03/15/20.  In interim, presented to DOD 03/27/20 with CP.  Stopped Entresto for low BP only briefly  Patient notes that his BP has improved and he is back on Entresto.  Has not had chest pain since DOD visit.  Notes that his breathing is largely inhibited by his deviated septum.  No weight gain, no abdominal swelling, No PND, or orthopnea. No SOB. 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 No palpitations. No bleeding issues. No DOE. No Syncope.  Ambulatory BP 120/72  Past Medical History:  Diagnosis Date  . Abdominal aortic aneurysm (AAA) (HCC)   . Anxiety   . Arthritis   . BBB (bundle branch block)   . CAD (coronary artery disease)   . CHF (congestive heart failure) (HCC)   . COPD (chronic obstructive pulmonary disease) (HCC)   . CPAP (continuous positive airway pressure) dependence   . Depression   . DM (diabetes mellitus) (HCC)   . DOE (dyspnea on exertion)   . HLD (hyperlipidemia)   . Hypertension   . Obesity   . Orthopnea   . OSA (obstructive sleep apnea)   . Sleep apnea   . SOB (shortness of breath)   . Thyroid disease     Past Surgical History:  Procedure Laterality Date  . CORONARY ANGIOPLASTY WITH STENT PLACEMENT    . VASECTOMY      Current Medications: Current Meds  Medication Sig  . ACCU-CHEK GUIDE  test strip   . Accu-Chek Softclix Lancets lancets 1 each 3 (three) times daily.  Marland Kitchen acetaminophen (TYLENOL) 500 MG tablet Take 500 mg by mouth every 6 (six) hours as needed.  Marland Kitchen albuterol (VENTOLIN HFA) 108 (90 Base) MCG/ACT inhaler Inhale into the lungs.  Marland Kitchen apixaban (ELIQUIS) 5 MG TABS tablet Take 5 mg by mouth 2 (two) times daily.  Marland Kitchen aspirin EC 81 MG tablet Take 81 mg by mouth daily. Swallow whole.  . furosemide (LASIX) 40 MG tablet Take 40 mg by mouth.  . metFORMIN (GLUCOPHAGE) 500 MG tablet Take by mouth 2 (two) times daily with a meal.  . metoprolol succinate (TOPROL-XL) 100 MG 24 hr tablet Take 100 mg by mouth in the morning and at bedtime. Take with or immediately following a meal.   . Multiple Vitamin (MULTIVITAMIN) capsule Take 1 capsule by mouth daily.  . sacubitril-valsartan (ENTRESTO) 24-26 MG Take 1 tablet by mouth 2 (two) times daily.  Marland Kitchen tiotropium (SPIRIVA) 18 MCG inhalation capsule Place 18 mcg into inhaler and inhale daily.    Allergies:   Lipitor [atorvastatin]   Social History   Socioeconomic History  . Marital status: Married    Spouse name: Not on file  . Number of children: Not on file  . Years of education: Not on file  . Highest education level: Not on file  Occupational History  . Not on file  Tobacco Use  . Smoking status: Former Smoker    Types: Cigarettes, Pipe  . Smokeless tobacco: Never Used  Vaping Use  . Vaping Use: Never used  Substance and Sexual Activity  . Alcohol use: Yes    Alcohol/week: 20.0 standard drinks    Types: 6 Cans of beer, 14 Glasses of wine per week  . Drug use: Never  . Sexual activity: Yes    Birth control/protection: Surgical    Comment: Vascetomy  Other Topics Concern  . Not on file  Social History Narrative  . Not on file   Social Determinants of Health   Financial Resource Strain:   . Difficulty of Paying Living Expenses: Not on file  Food Insecurity:   . Worried About Programme researcher, broadcasting/film/video in the Last Year: Not on  file  . Ran Out of Food in the Last Year: Not on file  Transportation Needs:   . Lack of Transportation (Medical): Not on file  . Lack of Transportation (Non-Medical): Not on file  Physical Activity:   . Days of Exercise per Week: Not on file  . Minutes of Exercise per Session: Not on file  Stress:   . Feeling of Stress : Not on file  Social Connections:   . Frequency of Communication with Friends and Family: Not on file  . Frequency of Social Gatherings with Friends and Family: Not on file  . Attends Religious Services: Not on file  . Active Member of Clubs or Organizations: Not on file  . Attends Banker Meetings: Not on file  . Marital Status: Not on file    Family History: The patient's notes heart disease in her family Sister had valve replacement with at age the age of 77 Brother had prior valve repair NOS  ROS:   Please see the history of present illness.    All other systems reviewed and are negative.  EKGs/Labs/Other Studies Reviewed:    The following studies were reviewed today:  EKG:   Atrial fibrillation rate 68 with RBBB  Old Records from [facility] August 2021. LCP 01/03/20 at [facility] with D2 80% prox LCx 60%.  Prior atrial flutter with ablation 8 years ago complicated by asystole per patient with long standing persistent atrial fibrillation CHADSVASC 4.   Personal Reviewed CD: [facility] LCP and Echo- has 80% ostial D2 lesion, medium sized lesion.  EF 30-35% moderate LA.  Would be difficult intervention.  Physical Exam:    VS:  BP 120/72   Pulse 62   Ht 5\' 10"  (1.778 m)   Wt 280 lb (127 kg)   BMI 40.18 kg/m     Wt Readings from Last 3 Encounters:  04/16/20 280 lb (127 kg)  04/11/20 283 lb (128.4 kg)  03/28/20 280 lb (127 kg)    GEN:  Obese, well developed in no acute distress HEENT: Normal NECK: No JVD; No carotid bruits LYMPHATICS: No lymphadenopathy CARDIAC: irregularly irregular, no murmurs,  rubs, gallops RESPIRATORY:  Clear to auscultation without rales, wheezing or rhonchi  ABDOMEN: Soft, non-tender, non-distended MUSCULOSKELETAL:  No edema; No deformity  SKIN: Warm and dry NEUROLOGIC:  Alert and oriented x 3 PSYCHIATRIC:  Normal affect   ASSESSMENT:    No diagnosis found. PLAN:    In order of problems listed above:  Chronic Decompensated Heart Failure  Morbid Obesity DM with HTN - NYHA class II, Stage B, slightly euvolemic, etiology from  CAD vs AF mixed pictures - Will continue lasix 40 mg PO daily - BMP, Mg - Strict I/Os, daily weights, and fluid restriction of < 2 L  - Continue Toprol Xl 100 mg - Will hope tol trial aldactone after labs - SGLTi at next eval  - Will get TTE- scheduled 05/04/20  Coronary Artery Disease; Obstructive in Diag and OM2 borderline but technically difficult (discussed with IC) - symptomatic mildly - Continue Eliquis, will drop aspirin - continue statin - continue BB as above  Long Standing, Persistent Atrial Fibrillation CHADSVASC 4 Daytime Somnolense  - BB and AC as above - OSA Eval; Epworth score of 18; will do sleep study - Alcohol down to 6 back a week  -will schedule DCCV- discussed the risks and benefits and patient is amenable  ~ 2 months follow up unless new symptoms or abnormal test results warranting change in plan  Would be reasonable for Virtual Follow up Would be reasonable for APP Follow up   Medication Adjustments/Labs and Tests Ordered: Current medicines are reviewed at length with the patient today.  Concerns regarding medicines are outlined above.  No orders of the defined types were placed in this encounter.  No orders of the defined types were placed in this encounter.   There are no Patient Instructions on file for this visit.   Signed, Christell Constant, MD  04/16/2020 10:19 AM    [facility]

## 2020-04-18 ENCOUNTER — Other Ambulatory Visit: Payer: Self-pay | Admitting: Emergency Medicine

## 2020-04-18 DIAGNOSIS — E119 Type 2 diabetes mellitus without complications: Secondary | ICD-10-CM

## 2020-04-18 DIAGNOSIS — E039 Hypothyroidism, unspecified: Secondary | ICD-10-CM

## 2020-04-18 DIAGNOSIS — R7989 Other specified abnormal findings of blood chemistry: Secondary | ICD-10-CM

## 2020-04-18 DIAGNOSIS — Z794 Long term (current) use of insulin: Secondary | ICD-10-CM

## 2020-04-18 MED ORDER — LEVOTHYROXINE SODIUM 50 MCG PO TABS: 50.0000 ug | ORAL_TABLET | Freq: Every day | ORAL | 1 refills | Status: DC

## 2020-04-18 MED ORDER — TRULICITY 3 MG/0.5ML ~~LOC~~ SOAJ: SUBCUTANEOUS | 3 refills | Status: DC

## 2020-04-18 MED ORDER — TRULICITY 1.5 MG/0.5ML ~~LOC~~ SOAJ: SUBCUTANEOUS | 3 refills | Status: DC

## 2020-04-20 ENCOUNTER — Telehealth: Payer: Self-pay | Admitting: *Deleted

## 2020-04-20 NOTE — Telephone Encounter (Signed)
-----   Message from Lattie Haw, RN sent at 04/16/2020  1:56 PM EST ----- Regarding: SLEEP STUDY Per Dr. Izora Ribas, patient's ESS=18 and he has daytime somnolence. He will need a sleep study. Please pre-cert and schedule.  Thanks

## 2020-04-27 ENCOUNTER — Other Ambulatory Visit: Payer: Self-pay | Admitting: Physician Assistant

## 2020-04-27 NOTE — Telephone Encounter (Signed)
We have not filled these medications for the patient. Last visit was 04/11/20 to establish care .

## 2020-04-27 NOTE — Telephone Encounter (Signed)
We have not prescribed this for the patient. Last office visit was 04/11/20 and was to establish care.

## 2020-04-27 NOTE — Telephone Encounter (Signed)
Furosemide and Klor Con have not been filled by our office. Last office visit was 04/11/20 and was to establish care. Patient has no upcoming appointments with our office. Please advise.

## 2020-05-04 ENCOUNTER — Ambulatory Visit (HOSPITAL_COMMUNITY): Payer: Medicare PPO | Attending: Internal Medicine

## 2020-05-04 ENCOUNTER — Other Ambulatory Visit: Payer: Medicare PPO | Admitting: *Deleted

## 2020-05-04 ENCOUNTER — Ambulatory Visit: Payer: Medicare PPO

## 2020-05-04 ENCOUNTER — Other Ambulatory Visit: Payer: Self-pay

## 2020-05-04 ENCOUNTER — Encounter: Payer: Self-pay | Admitting: Endocrinology

## 2020-05-04 DIAGNOSIS — I4819 Other persistent atrial fibrillation: Secondary | ICD-10-CM

## 2020-05-04 DIAGNOSIS — I2510 Atherosclerotic heart disease of native coronary artery without angina pectoris: Secondary | ICD-10-CM | POA: Diagnosis present

## 2020-05-04 DIAGNOSIS — I5020 Unspecified systolic (congestive) heart failure: Secondary | ICD-10-CM

## 2020-05-04 DIAGNOSIS — R400 Somnolence: Secondary | ICD-10-CM

## 2020-05-04 LAB — BASIC METABOLIC PANEL
BUN/Creatinine Ratio: 17 (ref 10–24)
BUN: 20 mg/dL (ref 8–27)
CO2: 25 mmol/L (ref 20–29)
Calcium: 9.9 mg/dL (ref 8.6–10.2)
Chloride: 101 mmol/L (ref 96–106)
Creatinine, Ser: 1.17 mg/dL (ref 0.76–1.27)
GFR calc Af Amer: 72 mL/min/{1.73_m2} (ref >59)
GFR calc non Af Amer: 62 mL/min/{1.73_m2} (ref >59)
Glucose: 179 mg/dL — ABNORMAL HIGH (ref 65–99)
Potassium: 4.8 mmol/L (ref 3.5–5.2)
Sodium: 140 mmol/L (ref 134–144)

## 2020-05-04 LAB — CBC
Hematocrit: 45.7 % (ref 37.5–51.0)
Hemoglobin: 15.4 g/dL (ref 13.0–17.7)
MCH: 32.3 pg (ref 26.6–33.0)
MCHC: 33.7 g/dL (ref 31.5–35.7)
MCV: 96 fL (ref 79–97)
Platelets: 229 10*3/uL (ref 150–450)
RBC: 4.77 x10E6/uL (ref 4.14–5.80)
RDW: 12.5 % (ref 11.6–15.4)
WBC: 6.7 10*3/uL (ref 3.4–10.8)

## 2020-05-04 LAB — ECHOCARDIOGRAM COMPLETE
MV M vel: 3.44 m/s
MV Peak grad: 47.2 mmHg
Radius: 0.73 cm
S' Lateral: 4.5 cm

## 2020-05-04 MED ORDER — PERFLUTREN LIPID MICROSPHERE
1.0000 mL | INTRAVENOUS | Status: AC | PRN
  Administered 2020-05-04: 2 mL via INTRAVENOUS

## 2020-05-07 ENCOUNTER — Telehealth: Payer: Self-pay

## 2020-05-07 DIAGNOSIS — I4819 Other persistent atrial fibrillation: Secondary | ICD-10-CM

## 2020-05-07 NOTE — Telephone Encounter (Signed)
Left message for patient with results. Advised to call back with any questions. Referral placed for EP.

## 2020-05-07 NOTE — Telephone Encounter (Signed)
-----   Message from Christell Constant, MD sent at 05/07/2020 10:30 AM EST ----- Results: Improvement in LVEF with atrial functional MR Plan: Continue current plan and have EP referral for some time after 05/24/20 as he will benefit from controlling his rhythm  Christell Constant, MD

## 2020-05-09 ENCOUNTER — Other Ambulatory Visit: Payer: Self-pay

## 2020-05-09 ENCOUNTER — Ambulatory Visit (INDEPENDENT_AMBULATORY_CARE_PROVIDER_SITE_OTHER): Payer: Medicare PPO | Admitting: Physician Assistant

## 2020-05-09 ENCOUNTER — Encounter: Payer: Self-pay | Admitting: Physician Assistant

## 2020-05-09 VITALS — BP 110/70 | HR 102 | Temp 98.4°F | Resp 16 | Ht 70.0 in | Wt 284.0 lb

## 2020-05-09 DIAGNOSIS — M159 Polyosteoarthritis, unspecified: Secondary | ICD-10-CM

## 2020-05-09 MED ORDER — TRAMADOL HCL 50 MG PO TABS: 50.0000 mg | ORAL_TABLET | Freq: Two times a day (BID) | ORAL | 0 refills | Status: AC | PRN

## 2020-05-09 NOTE — Patient Instructions (Signed)
Please try to work on keeping up and moving -- recommend water aerobics or swimming when the weather warms up.   You can also look into Tai Chi -- there is a lot of research on this and helping with chronic pain of different types.   You can use OTC Voltaren gel but no Ibuprofen, Celebrex or other NSAID (non-steroidal anti-inflammatory drugs). I have sent in a trial of Tramadol to use as directed when needed for breakthrough pain or days where you have to do a lot of walking.   Let's follow-up in a few weeks (can be via video) to see how things are going.   Sooner if needed.   Merry Christmas!!

## 2020-05-09 NOTE — Progress Notes (Signed)
Patient presents to clinic today c/o ongoing pain in hips and knees bilaterally, worsening over the past few years. Notes pain is worse in the evening after being on his feet. Is alleviated by laying down or getting off of his feet. Denies any noted swelling. Denies recent trauma or injury to the areas. Denies prolonged morning stiffness. Has taken some of his wife's Celebrex with some improvement. Is currently on Eliquis.   Past Medical History:  Diagnosis Date  . Abdominal aortic aneurysm (AAA) ([place])   . Anxiety   . Arthritis   . BBB (bundle branch block)   . CAD (coronary artery disease)   . CHF (congestive heart failure) ([place])   . COPD (chronic obstructive pulmonary disease) ([place])   . CPAP (continuous positive airway pressure) dependence   . Depression   . DM (diabetes mellitus) ([place])   . DOE (dyspnea on exertion)   . HLD (hyperlipidemia)   . Hypertension   . Obesity   . Orthopnea   . OSA (obstructive sleep apnea)   . Sleep apnea   . SOB (shortness of breath)   . Thyroid disease     Current Outpatient Medications on File Prior to Visit  Medication Sig Dispense Refill  . ACCU-CHEK GUIDE test strip     . Accu-Chek Softclix Lancets lancets 1 each 3 (three) times daily.    Marland Kitchen acetaminophen (TYLENOL) 500 MG tablet Take 500 mg by mouth every 6 (six) hours as needed.    Marland Kitchen albuterol (VENTOLIN HFA) 108 (90 Base) MCG/ACT inhaler Inhale into the lungs.    . Dulaglutide (TRULICITY) 1.5 TG/5.4DI SOPN Inject 1.5 mg once weekly for 4 weeks then increase to 3 mg weekly 15 mL 3  . Dulaglutide (TRULICITY) 3 YM/4.1RA SOPN Start after 4 weeks of the 1.5 mg weekly 15 mL 3  . ELIQUIS 5 MG TABS tablet TAKE 1 TABLET BY MOUTH TWICE A DAY 180 tablet 0  . furosemide (LASIX) 20 MG tablet TAKE 1 TABLET BY MOUTH EVERY DAY IN THE MORNING 90 tablet 0  . KLOR-CON M10 10 MEQ tablet TAKE 1 TABLET BY MOUTH EVERY DAY 90 tablet 0  . levothyroxine (SYNTHROID) 50 MCG tablet Take 1 tablet (50 mcg total) by mouth  daily. 30 tablet 1  . metFORMIN (GLUCOPHAGE) 500 MG tablet TAKE 2 TABLETS (1,000 MG TOTAL) BY MOUTH 2 (TWO) TIMES DAILY AS NEEDED. **MUST ESTABLISH LOCAL PCP** 120 tablet 1  . metoprolol (TOPROL-XL) 200 MG 24 hr tablet TAKE 1 TABLET BY MOUTH NIGHTLY 90 tablet 0  . Multiple Vitamin (MULTIVITAMIN) capsule Take 1 capsule by mouth daily.    . rosuvastatin (CRESTOR) 40 MG tablet TAKE 1 TABLET BY MOUTH EVERY DAY 90 tablet 0  . sacubitril-valsartan (ENTRESTO) 24-26 MG Take 1 tablet by mouth 2 (two) times daily.    Deborah Chalk ER 120 MG 24 hr capsule TAKE 1 CAPSULE (120 MG TOTAL) BY MOUTH NIGHTLY. 90 capsule 0  . tiotropium (SPIRIVA) 18 MCG inhalation capsule Place 18 mcg into inhaler and inhale daily.     No current facility-administered medications on file prior to visit.    Allergies  Allergen Reactions  . Lipitor [Atorvastatin] Other (See Comments)    Myalgias, flu like symptoms    Family History  Problem Relation Age of Onset  . Depression Mother   . Heart disease Mother   . Hyperlipidemia Mother   . Stroke Mother   . Cancer Father   . Diabetes Father   . Early death  Father   . Arthritis Sister   . Birth defects Sister   . Heart disease Sister   . Early death Brother   . Birth defects Brother   . Heart disease Brother     Social History   Socioeconomic History  . Marital status: Married    Spouse name: Not on file  . Number of children: Not on file  . Years of education: Not on file  . Highest education level: Not on file  Occupational History  . Not on file  Tobacco Use  . Smoking status: Former Smoker    Types: Cigarettes, Pipe  . Smokeless tobacco: Never Used  Vaping Use  . Vaping Use: Never used  Substance and Sexual Activity  . Alcohol use: Yes    Alcohol/week: 20.0 standard drinks    Types: 6 Cans of beer, 14 Glasses of wine per week  . Drug use: Never  . Sexual activity: Yes    Birth control/protection: Surgical    Comment: Vascetomy  Other Topics Concern   . Not on file  Social History Narrative  . Not on file   Social Determinants of Health   Financial Resource Strain: Not on file  Food Insecurity: Not on file  Transportation Needs: Not on file  Physical Activity: Not on file  Stress: Not on file  Social Connections: Not on file   Review of Systems - See HPI.  All other ROS are negative.  BP 110/70   Pulse (!) 102   Temp 98.4 F (36.9 C) (Temporal)   Resp 16   Ht 5' 10" (1.778 m)   Wt 284 lb (128.8 kg)   SpO2 97%   BMI 40.75 kg/m   Physical Exam Vitals reviewed.  Constitutional:      Appearance: Normal appearance. He is obese.  HENT:     Head: Normocephalic and atraumatic.  Cardiovascular:     Rate and Rhythm: Normal rate and regular rhythm.     Heart sounds: Normal heart sounds.  Pulmonary:     Effort: Pulmonary effort is normal.     Breath sounds: Normal breath sounds.  Musculoskeletal:     Cervical back: Neck supple.     Right hip: No deformity or bony tenderness. Normal range of motion. Normal strength.     Left hip: No deformity or bony tenderness. Normal range of motion. Normal strength.     Right upper leg: Normal.     Left upper leg: Normal.     Right knee: Crepitus present. No swelling, deformity or bony tenderness. Normal range of motion. No tenderness. Normal patellar mobility.     Left knee: Crepitus present. No swelling, deformity or bony tenderness. Normal range of motion. No tenderness. Normal patellar mobility.     Right lower leg: Normal. No edema.     Left lower leg: Normal. No edema.  Neurological:     Mental Status: He is alert.     Recent Results (from the past 2160 hour(s))  Basic metabolic panel     Status: Abnormal   Collection Time: 03/15/20 12:27 PM  Result Value Ref Range   Glucose 171 (H) 65 - 99 mg/dL   BUN 17 8 - 27 mg/dL   Creatinine, Ser 1.04 0.76 - 1.27 mg/dL   GFR calc non Af Amer 71 >59 mL/min/1.73   GFR calc Af Amer 83 >59 mL/min/1.73    Comment: **In accordance with  recommendations from the NKF-ASN Task force,**   [facility] is in the process  of updating its eGFR calculation to the   2021 CKD-EPI creatinine equation that estimates kidney function   without a race variable.    BUN/Creatinine Ratio 16 10 - 24   Sodium 140 134 - 144 mmol/L   Potassium 4.3 3.5 - 5.2 mmol/L   Chloride 100 96 - 106 mmol/L   CO2 25 20 - 29 mmol/L   Calcium 9.7 8.6 - 10.2 mg/dL  Pro b natriuretic peptide (BNP)     Status: None   Collection Time: 03/15/20 12:27 PM  Result Value Ref Range   NT-Pro BNP 256 0 - 376 pg/mL    Comment: The following cut-points have been suggested for the use of proBNP for the diagnostic evaluation of heart failure (HF) in patients with acute dyspnea: Modality                     Age           Optimal Cut                            (years)            Point ------------------------------------------------------ Diagnosis (rule in HF)        <50            450 pg/mL                           50 - 75            900 pg/mL                               >75           1800 pg/mL Exclusion (rule out HF)  Age independent     300 pg/mL   Magnesium     Status: None   Collection Time: 03/15/20 12:27 PM  Result Value Ref Range   Magnesium 1.7 1.6 - 2.3 mg/dL  Comprehensive metabolic panel     Status: Abnormal   Collection Time: 04/11/20  9:27 AM  Result Value Ref Range   Sodium 138 135 - 145 mEq/L   Potassium 4.3 3.5 - 5.1 mEq/L   Chloride 100 96 - 112 mEq/L   CO2 28 19 - 32 mEq/L   Glucose, Bld 245 (H) 70 - 99 mg/dL   BUN 17 6 - 23 mg/dL   Creatinine, Ser 1.06 0.40 - 1.50 mg/dL   Total Bilirubin 0.8 0.2 - 1.2 mg/dL   Alkaline Phosphatase 41 39 - 117 U/L   AST 47 (H) 0 - 37 U/L   ALT 49 0 - 53 U/L   Total Protein 6.9 6.0 - 8.3 g/dL   Albumin 4.4 3.5 - 5.2 g/dL   GFR 70.31 >60.00 mL/min    Comment: Calculated using the CKD-EPI Creatinine Equation (2021)   Calcium 9.6 8.4 - 10.5 mg/dL  Hemoglobin A1c     Status: Abnormal   Collection Time:  04/11/20  9:27 AM  Result Value Ref Range   Hgb A1c MFr Bld 8.5 (H) 4.6 - 6.5 %    Comment: Glycemic Control Guidelines for People with Diabetes:Non Diabetic:  <6%Goal of Therapy: <7%Additional Action Suggested:  >8%   Lipid panel     Status: Abnormal   Collection Time: 04/11/20  9:27 AM  Result Value Ref Range  Cholesterol 135 0 - 200 mg/dL    Comment: ATP III Classification       Desirable:  < 200 mg/dL               Borderline High:  200 - 239 mg/dL          High:  > = 240 mg/dL   Triglycerides 178.0 (H) 0.0 - 149.0 mg/dL    Comment: Normal:  <150 mg/dLBorderline High:  150 - 199 mg/dL   HDL 41.50 >39.00 mg/dL   VLDL 35.6 0.0 - 40.0 mg/dL   LDL Cholesterol 58 0 - 99 mg/dL   Total CHOL/HDL Ratio 3     Comment:                Men          Women1/2 Average Risk     3.4          3.3Average Risk          5.0          4.42X Average Risk          9.6          7.13X Average Risk          15.0          11.0                       NonHDL 93.39     Comment: NOTE:  Non-HDL goal should be 30 mg/dL higher than patient's LDL goal (i.e. LDL goal of < 70 mg/dL, would have non-HDL goal of < 100 mg/dL)  Urine Microalbumin w/creat. ratio     Status: None   Collection Time: 04/11/20  9:27 AM  Result Value Ref Range   Microalb, Ur <0.7 0.0 - 1.9 mg/dL   Creatinine,U 25.3 mg/dL   Microalb Creat Ratio 2.8 0.0 - 30.0 mg/g  TSH     Status: Abnormal   Collection Time: 04/11/20  9:27 AM  Result Value Ref Range   TSH 6.35 (H) 0.35 - 4.50 uIU/mL  Testosterone     Status: Abnormal   Collection Time: 04/11/20  9:27 AM  Result Value Ref Range   Testosterone 163.48 (L) 300.00 - 890.00 ng/dL  PSA, Medicare     Status: None   Collection Time: 04/11/20  9:27 AM  Result Value Ref Range   PSA 0.55 0.10 - 4.00 ng/ml    Comment: Test performed using Access Hybritech PSA Assay, a parmagnetic partical, chemiluminecent immunoassay.  Basic metabolic panel     Status: Abnormal   Collection Time: 04/16/20 10:57 AM   Result Value Ref Range   Glucose 340 (H) 65 - 99 mg/dL   BUN 19 8 - 27 mg/dL   Creatinine, Ser 1.10 0.76 - 1.27 mg/dL   GFR calc non Af Amer 67 >59 mL/min/1.73   GFR calc Af Amer 77 >59 mL/min/1.73    Comment: **In accordance with recommendations from the NKF-ASN Task force,**   [facility] is in the process of updating its eGFR calculation to the   2021 CKD-EPI creatinine equation that estimates kidney function   without a race variable.    BUN/Creatinine Ratio 17 10 - 24   Sodium 138 134 - 144 mmol/L   Potassium 4.7 3.5 - 5.2 mmol/L   Chloride 101 96 - 106 mmol/L   CO2 21 20 - 29 mmol/L   Calcium 9.6 8.6 - 10.2 mg/dL  Magnesium  Status: None   Collection Time: 04/16/20 10:57 AM  Result Value Ref Range   Magnesium 1.9 1.6 - 2.3 mg/dL  Basic metabolic panel     Status: Abnormal   Collection Time: 05/04/20  8:39 AM  Result Value Ref Range   Glucose 179 (H) 65 - 99 mg/dL   BUN 20 8 - 27 mg/dL   Creatinine, Ser 1.17 0.76 - 1.27 mg/dL   GFR calc non Af Amer 62 >59 mL/min/1.73   GFR calc Af Amer 72 >59 mL/min/1.73    Comment: **In accordance with recommendations from the NKF-ASN Task force,**   [facility] is in the process of updating its eGFR calculation to the   2021 CKD-EPI creatinine equation that estimates kidney function   without a race variable.    BUN/Creatinine Ratio 17 10 - 24   Sodium 140 134 - 144 mmol/L   Potassium 4.8 3.5 - 5.2 mmol/L   Chloride 101 96 - 106 mmol/L   CO2 25 20 - 29 mmol/L   Calcium 9.9 8.6 - 10.2 mg/dL  CBC     Status: None   Collection Time: 05/04/20  8:39 AM  Result Value Ref Range   WBC 6.7 3.4 - 10.8 x10E3/uL   RBC 4.77 4.14 - 5.80 x10E6/uL   Hemoglobin 15.4 13.0 - 17.7 g/dL   Hematocrit 45.7 37.5 - 51.0 %   MCV 96 79 - 97 fL   MCH 32.3 26.6 - 33.0 pg   MCHC 33.7 31.5 - 35.7 g/dL   RDW 12.5 11.6 - 15.4 %   Platelets 229 150 - 450 x10E3/uL  ECHOCARDIOGRAM COMPLETE     Status: None   Collection Time: 05/04/20  8:46 AM  Result Value Ref  Range   S' Lateral 4.50 cm   Radius 0.73 cm   MV M vel 3.44 m/s   MV Peak grad 47.2 mmHg    Assessment/Plan: 1. Generalized osteoarthritis of multiple sites Symptoms seem most indicative of osteoarthritis of multiple sites. Body mass index is 40.75 kg/m. Body habitus likely exacerbating affected joints -- knees and hips. Discussed stretching -- tai chi a good option. Also recommend water aerobics/swimming since he has access to pool. Oral NSAIDs are not option for treatment as he is on blood thinners. Can use topical Voltaren. Tylenol arthritis for milder pain. Rx Tramadol for more significant pain. Close follow-up scheduled.   - traMADol (ULTRAM) 50 MG tablet; Take 1 tablet (50 mg total) by mouth every 12 (twelve) hours as needed for up to 10 days for moderate pain.  Dispense: 20 tablet; Refill: 0  This visit occurred during the SARS-CoV-2 public health emergency.  Safety protocols were in place, including screening questions prior to the visit, additional usage of staff PPE, and extensive cleaning of exam room while observing appropriate contact time as indicated for disinfecting solutions.     Leeanne Rio, PA-C

## 2020-05-10 ENCOUNTER — Other Ambulatory Visit: Payer: Self-pay | Admitting: Physician Assistant

## 2020-05-10 DIAGNOSIS — E039 Hypothyroidism, unspecified: Secondary | ICD-10-CM

## 2020-05-14 ENCOUNTER — Other Ambulatory Visit (HOSPITAL_COMMUNITY): Payer: Medicare PPO

## 2020-05-14 ENCOUNTER — Other Ambulatory Visit: Payer: Self-pay | Admitting: Physician Assistant

## 2020-05-14 DIAGNOSIS — M159 Polyosteoarthritis, unspecified: Secondary | ICD-10-CM

## 2020-05-14 NOTE — Telephone Encounter (Signed)
LFD 05/09/20 LOV 05/09/20 NOV none

## 2020-05-21 ENCOUNTER — Ambulatory Visit: Payer: Medicare PPO

## 2020-05-22 ENCOUNTER — Other Ambulatory Visit (HOSPITAL_COMMUNITY)
Admission: RE | Admit: 2020-05-22 | Discharge: 2020-05-22 | Disposition: A | Discharge status: Home / Self Care | Payer: Medicare PPO | Source: Ambulatory Visit | Attending: Internal Medicine | Admitting: Internal Medicine

## 2020-05-22 ENCOUNTER — Telehealth: Payer: Self-pay | Admitting: *Deleted

## 2020-05-22 ENCOUNTER — Encounter: Payer: Self-pay | Admitting: *Deleted

## 2020-05-22 ENCOUNTER — Other Ambulatory Visit: Payer: Self-pay | Admitting: Physician Assistant

## 2020-05-22 DIAGNOSIS — Z20822 Contact with and (suspected) exposure to covid-19: Secondary | ICD-10-CM | POA: Diagnosis not present

## 2020-05-22 DIAGNOSIS — Z01818 Encounter for other preprocedural examination: Secondary | ICD-10-CM | POA: Diagnosis present

## 2020-05-22 LAB — SARS CORONAVIRUS 2 (TAT 6-24 HRS): SARS Coronavirus 2: NEGATIVE

## 2020-05-22 NOTE — Telephone Encounter (Signed)
Pt was scheduled to come into the office for tomorrow 1/5 for nurse visit EKG for upcoming DCCV on 05/24/20 for Dr. Izora Ribas to do.  RN Supervisor Dennis Bast went and spoke to Dr. Izora Ribas about this pt needing an updated H&P due to last one done was back in Nov 2021, where he was originally scheduled at that time to have his DCCV done, but had to reschedule this to sometime in Jan. Per Dr. Izora Ribas, he stated that he will do the pts H&P on his DCCV date of 05/24/20, for this is his pt, he will be doing his procedure at that time, and will update his H&P accordingly that day, prior to the procedure.  Also per Dr. Izora Ribas, pt does NOT need to report for his EKG nurse visit at the office tomorrow 1/5, for he will be getting the EKG done anyway's, the morning of his DCCV of 05/24/20, prior to the procedure.  Nurse visit EKG cancelled and pt aware that this appt is cancelled and no need for him to come in.  Pt will be needing to have updated labs done (CBC/BMP) done on the morning of his DCCV 05/24/20, for last set done was back in Dec.  Pt is aware that he will need to report to the hospital at 0830 on 05/24/20, for he will need to have these labs drawn prior to his procedure at 1000.  Also endorsed to the pt that I will send him updated DCCV instructions to him in his active mychart account.  Also went over these instructions with him verbatim on the phone.  Pt verbalized understanding and agrees with this plan. Pt was more than gracious for all the assistance provided. Below is updated DCCV instructions typed for the pt and sent to his active mychart accound as a pt message and in letter format.     Dear Douglas Cowan,   You are scheduled for a Cardioversion on 05/24/20 with Dr. Izora Ribas.  Please arrive at the [facility] (Main Entrance A) at [facility]: [address] [place], [place] [address] at 8:30  am .  DIET: Nothing to eat or drink after midnight except a sip of water  with medications (see medication instructions below)  Medication Instructions: -Hold furosemide and Metformin the morning of your procedure  -Continue your anticoagulant: Eliquis You will need to continue your anticoagulant after your procedure until you Are told by your Provider that it is safe to stop   Labs:  Come to: your lab work will be done at the hospital prior to your procedure - you will need to arrive 1  hours ahead of your procedure  You must have a responsible person to drive you home and stay in the waiting area during your procedure. Failure to do so could result in cancellation.  Bring your insurance cards.  *Special Note: Every effort is made to have your procedure done on time. Occasionally there are emergencies that occur at the hospital that may cause delays. Please be patient if a delay does occur.

## 2020-05-23 ENCOUNTER — Ambulatory Visit: Payer: Medicare PPO

## 2020-05-24 ENCOUNTER — Ambulatory Visit (HOSPITAL_COMMUNITY): Payer: Medicare PPO | Admitting: Certified Registered Nurse Anesthetist

## 2020-05-24 ENCOUNTER — Encounter (HOSPITAL_COMMUNITY): Payer: Self-pay | Admitting: Internal Medicine

## 2020-05-24 ENCOUNTER — Other Ambulatory Visit: Payer: Self-pay

## 2020-05-24 ENCOUNTER — Ambulatory Visit (HOSPITAL_COMMUNITY)
Admission: RE | Admit: 2020-05-24 | Discharge: 2020-05-24 | Disposition: A | Discharge status: Home / Self Care | Payer: Medicare PPO | Attending: Internal Medicine | Admitting: Internal Medicine

## 2020-05-24 ENCOUNTER — Encounter (HOSPITAL_COMMUNITY)
Admission: RE | Disposition: A | Discharge status: Home / Self Care | Payer: Self-pay | Source: Home / Self Care | Attending: Internal Medicine

## 2020-05-24 DIAGNOSIS — Z7901 Long term (current) use of anticoagulants: Secondary | ICD-10-CM | POA: Diagnosis not present

## 2020-05-24 DIAGNOSIS — Z87891 Personal history of nicotine dependence: Secondary | ICD-10-CM | POA: Diagnosis not present

## 2020-05-24 DIAGNOSIS — Z794 Long term (current) use of insulin: Secondary | ICD-10-CM | POA: Diagnosis not present

## 2020-05-24 DIAGNOSIS — I340 Nonrheumatic mitral (valve) insufficiency: Secondary | ICD-10-CM | POA: Insufficient documentation

## 2020-05-24 DIAGNOSIS — I4811 Longstanding persistent atrial fibrillation: Secondary | ICD-10-CM | POA: Diagnosis not present

## 2020-05-24 DIAGNOSIS — Z79899 Other long term (current) drug therapy: Secondary | ICD-10-CM | POA: Insufficient documentation

## 2020-05-24 DIAGNOSIS — I4891 Unspecified atrial fibrillation: Secondary | ICD-10-CM

## 2020-05-24 HISTORY — PX: CARDIOVERSION: SHX1299

## 2020-05-24 SURGERY — CARDIOVERSION
Anesthesia: General

## 2020-05-24 MED ORDER — LIDOCAINE 2% (20 MG/ML) 5 ML SYRINGE
INTRAMUSCULAR | Status: DC | PRN
  Administered 2020-05-24: 100 mg via INTRAVENOUS

## 2020-05-24 MED ORDER — SODIUM CHLORIDE 0.9 % IV SOLN: INTRAVENOUS | Status: DC | PRN

## 2020-05-24 MED ORDER — PROPOFOL 10 MG/ML IV BOLUS
INTRAVENOUS | Status: DC | PRN
  Administered 2020-05-24: 100 mg via INTRAVENOUS

## 2020-05-24 NOTE — CV Procedure (Signed)
   Electrical Cardioversion Procedure Note Douglas Cowan [id number] May 19, 1946  Procedure: Electrical Cardioversion Indications:  Atrial Fibrillation  Time Out: Verified patient identification, verified procedure,medications/allergies/relevent history reviewed, required imaging and test results available.  Performed  Procedure Details  The patient was NPO after midnight. Anesthesia was administered at the beside  by Dr.Germeroth with 100 mg of lidocaine and 100 mg propofol.  Cardioversion was done with synchronized biphasic defibrillation with AP pads with 200 Joules X 2.  The patient converted to normal sinus rhythm. The patient tolerated the procedure well, with only transient hypoxia.  IMPRESSION:  Successful cardioversion of atrial fibrillation    Douglas Cowan A Douglas Cowan 05/24/2020, 10:29 AM

## 2020-05-24 NOTE — H&P (Signed)
Cardiology Admission History and Physical:   Patient ID: Douglas Cowan MRN: [id number]; DOB: [birth year]   Admission date: (Not on file)  Primary Care Provider: Brunetta Jeans, PA-C [facility] Cardiologist: Rudean Haskell MD [place] Electrophysiologist:  None   Chief Complaint:  Mild to Moderate Mitral regurgitation with atrial fibrillation  Patient Profile:   Douglas Cowan is a 75 y.o. male with  a hx of CAD with prior stents in 2011, LCP 01/03/20 at [facility] with D2 80% (bifurcation lesion) prox LCx 60%.  Prior atrial flutter with ablation 8 years ago complicated by asystole per patient with long standing persistent atrial fibrillation CHADSVASC 4, question of HFrEF EF 35-40% with inferior WMA and severe LA dilation ([place]) 01/02/20.   Diabetes, Morbid Obesity.  Presented to establish care 03/15/20.  In interim, presented to DOD 03/27/20 with CP.  As part of work up had improvement in EF but has likely atrial functional MR.  Planned for DCCV.  History of Present Illness:   Patient notes that he is doing OK.  Since last visit notes that he is still persistently tired.  Relevant interval testing done today.  There are no interval hospital/ED visit.    No chest pain or pressure .  No SOB/DOE and no PND/Orthopnea.  No weight gain or leg swelling.  No palpitations or syncope .   Past Medical History:  Diagnosis Date   Abdominal aortic aneurysm (AAA) (HCC)    Anxiety    Arthritis    BBB (bundle branch block)    CAD (coronary artery disease)    CHF (congestive heart failure) (HCC)    COPD (chronic obstructive pulmonary disease) (HCC)    CPAP (continuous positive airway pressure) dependence    Depression    DM (diabetes mellitus) ([place])    DOE (dyspnea on exertion)    HLD (hyperlipidemia)    Hypertension    Obesity    Orthopnea    OSA (obstructive sleep apnea)    Sleep apnea    SOB (shortness of breath)    Thyroid disease     Past  Surgical History:  Procedure Laterality Date   CORONARY ANGIOPLASTY WITH STENT PLACEMENT     VASECTOMY       Medications Prior to Admission: Prior to Admission medications   Medication Sig Start Date End Date Taking? Authorizing Provider  acetaminophen (TYLENOL) 500 MG tablet Take 500 mg by mouth every 6 (six) hours as needed for moderate pain.   Yes [provider]  albuterol (VENTOLIN HFA) 108 (90 Base) MCG/ACT inhaler Inhale 2 puffs into the lungs every 6 (six) hours as needed for wheezing or shortness of breath. 04/02/20  Yes [provider]  Dulaglutide (TRULICITY) 1.5 HC/6.2BJ SOPN Inject 1.5 mg once weekly for 4 weeks then increase to 3 mg weekly Patient taking differently: Inject 3 mg into the skin every Sunday. 04/18/20  Yes Raiford Noble C, PA-C  ELIQUIS 5 MG TABS tablet TAKE 1 TABLET BY MOUTH TWICE A DAY Patient taking differently: Take 5 mg by mouth 2 (two) times daily. 04/27/20  Yes Brunetta Jeans, PA-C  furosemide (LASIX) 20 MG tablet TAKE 1 TABLET BY MOUTH EVERY DAY IN THE MORNING Patient taking differently: Take 20 mg by mouth daily. 04/27/20  Yes Raiford Noble C, PA-C  KLOR-CON M10 10 MEQ tablet TAKE 1 TABLET BY MOUTH EVERY DAY Patient taking differently: Take 10 mEq by mouth daily. 04/27/20  Yes Brunetta Jeans, PA-C  levothyroxine (SYNTHROID)  50 MCG tablet Take 1 tablet (50 mcg total) by mouth daily. 04/18/20  Yes Waldon Merl, PA-C  metFORMIN (GLUCOPHAGE) 500 MG tablet TAKE 2 TABLETS (1,000 MG TOTAL) BY MOUTH 2 (TWO) TIMES DAILY AS NEEDED. **MUST ESTABLISH LOCAL PCP** Patient taking differently: Take 1,000 mg by mouth 2 (two) times daily with a meal. 04/27/20  Yes Waldon Merl, PA-C  metoprolol (TOPROL-XL) 200 MG 24 hr tablet TAKE 1 TABLET BY MOUTH NIGHTLY Patient taking differently: Take 200 mg by mouth at bedtime. 04/27/20  Yes Waldon Merl, PA-C  Multiple Vitamin (MULTIVITAMIN) capsule Take 1 capsule by mouth daily.   Yes  [provider]  rosuvastatin (CRESTOR) 40 MG tablet TAKE 1 TABLET BY MOUTH EVERY DAY Patient taking differently: Take 40 mg by mouth daily. 04/27/20  Yes Waldon Merl, PA-C  sacubitril-valsartan (ENTRESTO) 24-26 MG Take 1 tablet by mouth 2 (two) times daily.   Yes [provider]  TIADYLT ER 120 MG 24 hr capsule TAKE 1 CAPSULE (120 MG TOTAL) BY MOUTH NIGHTLY. Patient taking differently: Take 120 mg by mouth every evening. 04/27/20  Yes Waldon Merl, PA-C  tiotropium (SPIRIVA) 18 MCG inhalation capsule Place 18 mcg into inhaler and inhale daily.   Yes [provider]  ACCU-CHEK GUIDE test strip  03/26/20   [provider]  Accu-Chek Softclix Lancets lancets 1 each 3 (three) times daily. 04/02/20   [provider]     Allergies:    Allergies  Allergen Reactions   Lipitor [Atorvastatin] Other (See Comments)    Myalgias, flu like symptoms    Social History:   Social History   Socioeconomic History   Marital status: Married    Spouse name: Not on file   Number of children: Not on file   Years of education: Not on file   Highest education level: Not on file  Occupational History   Not on file  Tobacco Use   Smoking status: Former Smoker    Types: Cigarettes, Pipe   Smokeless tobacco: Never Used  Building services engineer Use: Never used  Substance and Sexual Activity   Alcohol use: Yes    Alcohol/week: 20.0 standard drinks    Types: 6 Cans of beer, 14 Glasses of wine per week   Drug use: Never   Sexual activity: Yes    Birth control/protection: Surgical    Comment: Vascetomy  Other Topics Concern   Not on file  Social History Narrative   Not on file   Social Determinants of Health   Financial Resource Strain: Not on file  Food Insecurity: Not on file  Transportation Needs: Not on file  Physical Activity: Not on file  Stress: Not on file  Social Connections: Not on file  Intimate Partner Violence: Not on file     Family History:   The patient's family history includes Arthritis in his sister; Birth defects in his brother and sister; Cancer in his father; Depression in his mother; Diabetes in his father; Early death in his brother and father; Heart disease in his brother, mother, and sister; Hyperlipidemia in his mother; Stroke in his mother.    ROS:  Please see the history of present illness.  All other ROS reviewed and negative.     Physical Exam/Data:  There were no vitals filed for this visit. No intake or output data in the 24 hours ending 05/24/20 0818 Last 3 Weights 05/09/2020 04/16/2020 04/11/2020  Weight (lbs) 284 lb 280 lb 283  lb  Weight (kg) 128.822 kg 127.007 kg 128.368 kg     There is no height or weight on file to calculate BMI.  GEN:  Obese, well developed in no acute distress HEENT: Normal NECK: No JVD; No carotid bruits LYMPHATICS: No lymphadenopathy CARDIAC: irregularly irregular, no murmurs, rubs, gallops RESPIRATORY:  Clear to auscultation without rales, wheezing or rhonchi  ABDOMEN: Soft, non-tender, non-distended MUSCULOSKELETAL:  No edema; No deformity  SKIN: Warm and dry NEUROLOGIC:  Alert and oriented x 3 PSYCHIATRIC:  Normal affect   EKG:   04/16/20: Atrial fibrillation rate 68 with RBBB   Old Records from [facility] [place] August 2021. LCP 01/03/20 at [facility] with D2 80% prox LCx 60%.  Prior atrial flutter with ablation 8 years ago complicated by asystole per patient with long standing persistent atrial fibrillation CHADSVASC 4.    Personal Reviewed CD: [facility] LCP and Echo- has 80% ostial D2 lesion, medium sized lesion.  EF 30-35% moderate LA.  Would be difficult intervention.  Laboratory Data:  High Sensitivity Troponin:  No results for input(s): TROPONINIHS in the last 720 hours.    ChemistryNo results for input(s): NA, K, CL, CO2, GLUCOSE, BUN, CREATININE, CALCIUM, GFRNONAA, GFRAA, ANIONGAP in the last 168 hours.  No results  for input(s): PROT, ALBUMIN, AST, ALT, ALKPHOS, BILITOT in the last 168 hours. HematologyNo results for input(s): WBC, RBC, HGB, HCT, MCV, MCH, MCHC, RDW, PLT in the last 168 hours. BNPNo results for input(s): BNP, PROBNP in the last 168 hours.  DDimer No results for input(s): DDIMER in the last 168 hours.   Radiology/Studies:  No results found.   Assessment and Plan:   Long Standing, Persistent Atrial Fibrillation Mitral Regurgitation CHADSVASC 4 Daytime Somnolense  - BB and AC as above - no missed doses - Planned for DCCV today  For questions or updates, please contact [facility] Please consult [email or web address] for contact info under     Signed, Christell Constant, MD  05/24/2020 8:18 AM

## 2020-05-24 NOTE — Transfer of Care (Signed)
Immediate Anesthesia Transfer of Care Note  Patient: Douglas Cowan  Procedure(s) Performed: CARDIOVERSION (N/A )  Patient Location: PACU and Endoscopy Unit  Anesthesia Type:General  Level of Consciousness: patient cooperative and responds to stimulation  Airway & Oxygen Therapy: Patient Spontanous Breathing  Post-op Assessment: Report given to RN and Post -op Vital signs reviewed and stable  Post vital signs: Reviewed and stable  Last Vitals:  Vitals Value Taken Time  BP 96/56 05/24/20 1029  Temp    Pulse 55 05/24/20 1030  Resp 16 05/24/20 1030  SpO2 92 % 05/24/20 1030    Last Pain:  Vitals:   05/24/20 0913  TempSrc: Temporal  PainSc: 0-No pain         Complications: No complications documented.

## 2020-05-24 NOTE — Anesthesia Preprocedure Evaluation (Addendum)
Anesthesia Evaluation  Patient identified by MRN, date of birth, ID band Patient awake    Reviewed: Allergy & Precautions, NPO status , Patient's Chart, lab work & pertinent test results  Airway Mallampati: II  TM Distance: >3 FB Neck ROM: Full    Dental  (+) Dental Advisory Given   Pulmonary shortness of breath, sleep apnea , COPD, former smoker,    Pulmonary exam normal breath sounds clear to auscultation       Cardiovascular hypertension, Pt. on home beta blockers and Pt. on medications + CAD, +CHF, + Orthopnea and + DOE  Normal cardiovascular exam+ dysrhythmias + Valvular Problems/Murmurs MR  Rhythm:Regular Rate:Normal  Echo 05/04/2020 1. Left ventricular ejection fraction, by estimation, is 50% with beat to beat variability. The left ventricle has low normal function. Left ventricular endocardial border not optimally defined to evaluate regional wall motion despite the use of Definity. Inferior wall appears akinetic in short axis, inferolateral wall likely severely hypokinetic. There is mild left ventricular hypertrophy. Left ventricular diastolic parameters are indeterminate.  2. Right ventricular systolic function is mildly reduced. The right ventricular size is moderately enlarged. There is borderline mild pulmonary artery systolic pressure. The estimated right ventricular systolic pressure is 35.3 mmHg.  3. Left atrial size was mild to moderately dilated.  4. Mitral valve regurgitation likely eccentric and therefore may be underestimated, with evidence of splay artifact. MR is likely mild-moderate to moderate. The mitral valve is degenerative. Mild to moderate mitral valve regurgitation. No evidence of mitral stenosis. Moderate mitral annular calcification.  5. The aortic valve is grossly normal. There is mild calcification of the aortic valve. Aortic valve regurgitation is not visualized.  6. The inferior vena cava is normal  in size with greater than 50% respiratory variability, suggesting right atrial pressure of 3 mmHg.    Neuro/Psych PSYCHIATRIC DISORDERS Anxiety Depression negative neurological ROS     GI/Hepatic negative GI ROS, Neg liver ROS,   Endo/Other  diabetesHypothyroidism Morbid obesity  Renal/GU negative Renal ROS     Musculoskeletal  (+) Arthritis ,   Abdominal (+) + obese,   Peds  Hematology negative hematology ROS (+)   Anesthesia Other Findings   Reproductive/Obstetrics                           Anesthesia Physical Anesthesia Plan  ASA: III  Anesthesia Plan: General   Post-op Pain Management:    Induction: Intravenous  PONV Risk Score and Plan: 2 and Propofol infusion, Treatment may vary due to age or medical condition and TIVA  Airway Management Planned: Mask  Additional Equipment:   Intra-op Plan:   Post-operative Plan:   Informed Consent: I have reviewed the patients History and Physical, chart, labs and discussed the procedure including the risks, benefits and alternatives for the proposed anesthesia with the patient or authorized representative who has indicated his/her understanding and acceptance.     Dental advisory given  Plan Discussed with: CRNA  Anesthesia Plan Comments:        Anesthesia Quick Evaluation

## 2020-05-24 NOTE — Discharge Instructions (Signed)
Electrical Cardioversion Electrical cardioversion is the delivery of a jolt of electricity to restore a normal rhythm to the heart. A rhythm that is too fast or is not regular keeps the heart from pumping well. In this procedure, sticky patches or metal paddles are placed on the chest to deliver electricity to the heart from a device. This procedure may be done in an emergency if:  There is low or no blood pressure as a result of the heart rhythm.  Normal rhythm must be restored as fast as possible to protect the brain and heart from further damage.  It may save a life. This may also be a scheduled procedure for irregular or fast heart rhythms that are not immediately life-threatening. Tell a health care provider about:  Any allergies you have.  All medicines you are taking, including vitamins, herbs, eye drops, creams, and over-the-counter medicines.  Any problems you or family members have had with anesthetic medicines.  Any blood disorders you have.  Any surgeries you have had.  Any medical conditions you have.  Whether you are pregnant or may be pregnant. What are the risks? Generally, this is a safe procedure. However, problems may occur, including:  Allergic reactions to medicines.  A blood clot that breaks free and travels to other parts of your body.  The possible return of an abnormal heart rhythm within hours or days after the procedure.  Your heart stopping (cardiac arrest). This is rare. What happens before the procedure? Medicines  Your health care provider may have you start taking: ? Blood-thinning medicines (anticoagulants) so your blood does not clot as easily. ? Medicines to help stabilize your heart rate and rhythm.  Ask your health care provider about: ? Changing or stopping your regular medicines. This is especially important if you are taking diabetes medicines or blood thinners. ? Taking medicines such as aspirin and ibuprofen. These medicines can  thin your blood. Do not take these medicines unless your health care provider tells you to take them. ? Taking over-the-counter medicines, vitamins, herbs, and supplements. General instructions  Follow instructions from your health care provider about eating or drinking restrictions.  Plan to have someone take you home from the hospital or clinic.  If you will be going home right after the procedure, plan to have someone with you for 24 hours.  Ask your health care provider what steps will be taken to help prevent infection. These may include washing your skin with a germ-killing soap. What happens during the procedure?   An IV will be inserted into one of your veins.  Sticky patches (electrodes) or metal paddles may be placed on your chest.  You will be given a medicine to help you relax (sedative).  An electrical shock will be delivered. The procedure may vary among health care providers and hospitals. What can I expect after the procedure?  Your blood pressure, heart rate, breathing rate, and blood oxygen level will be monitored until you leave the hospital or clinic.  Your heart rhythm will be watched to make sure it does not change.  You may have some redness on the skin where the shocks were given. Follow these instructions at home:  Do not drive for 24 hours if you were given a sedative during your procedure.  Take over-the-counter and prescription medicines only as told by your health care provider.  Ask your health care provider how to check your pulse. Check it often.  Rest for 48 hours after the procedure or   as told by your health care provider.  Avoid or limit your caffeine use as told by your health care provider.  Keep all follow-up visits as told by your health care provider. This is important. Contact a health care provider if:  You feel like your heart is beating too quickly or your pulse is not regular.  You have a serious muscle cramp that does not go  away. Get help right away if:  You have discomfort in your chest.  You are dizzy or you feel faint.  You have trouble breathing or you are short of breath.  Your speech is slurred.  You have trouble moving an arm or leg on one side of your body.  Your fingers or toes turn cold or blue. Summary  Electrical cardioversion is the delivery of a jolt of electricity to restore a normal rhythm to the heart.  This procedure may be done right away in an emergency or may be a scheduled procedure if the condition is not an emergency.  Generally, this is a safe procedure.  After the procedure, check your pulse often as told by your health care provider. This information is not intended to replace advice given to you by your health care provider. Make sure you discuss any questions you have with your health care provider. Document Revised: 12/06/2018 Document Reviewed: 12/06/2018 Elsevier Patient Education  2020 [employer]  

## 2020-05-25 LAB — POCT I-STAT, CHEM 8
BUN: 15 mg/dL (ref 8–23)
Calcium, Ion: 1.24 mmol/L (ref 1.15–1.40)
Chloride: 102 mmol/L (ref 98–111)
Creatinine, Ser: 1 mg/dL (ref 0.61–1.24)
Glucose, Bld: 160 mg/dL — ABNORMAL HIGH (ref 70–99)
HCT: 45 % (ref 39.0–52.0)
Hemoglobin: 15.3 g/dL (ref 13.0–17.0)
Potassium: 4 mmol/L (ref 3.5–5.1)
Sodium: 140 mmol/L (ref 135–145)
TCO2: 26 mmol/L (ref 22–32)

## 2020-05-25 NOTE — Anesthesia Postprocedure Evaluation (Signed)
Anesthesia Post Note  Patient: Douglas Cowan  Procedure(s) Performed: CARDIOVERSION (N/A )     Patient location during evaluation: PACU Anesthesia Type: General Level of consciousness: sedated and patient cooperative Pain management: pain level controlled Vital Signs Assessment: post-procedure vital signs reviewed and stable Respiratory status: spontaneous breathing Cardiovascular status: stable Anesthetic complications: no   No complications documented.  Last Vitals:  Vitals:   05/24/20 1040 05/24/20 1050  BP: (!) 103/45 100/60  Pulse: (!) 50 (!) 51  Resp: 15 20  Temp:    SpO2: 94% 96%    Last Pain:  Vitals:   05/24/20 1050  TempSrc:   PainSc: 0-No pain                 Lewie Loron

## 2020-05-28 ENCOUNTER — Encounter: Payer: Self-pay | Admitting: Cardiology

## 2020-05-28 ENCOUNTER — Ambulatory Visit: Payer: Medicare PPO | Admitting: Cardiology

## 2020-05-28 ENCOUNTER — Other Ambulatory Visit: Payer: Self-pay

## 2020-05-28 VITALS — BP 106/68 | HR 59 | Ht 70.0 in | Wt 280.0 lb

## 2020-05-28 DIAGNOSIS — G4733 Obstructive sleep apnea (adult) (pediatric): Secondary | ICD-10-CM | POA: Diagnosis not present

## 2020-05-28 DIAGNOSIS — Z01812 Encounter for preprocedural laboratory examination: Secondary | ICD-10-CM

## 2020-05-28 DIAGNOSIS — I480 Paroxysmal atrial fibrillation: Secondary | ICD-10-CM

## 2020-05-28 DIAGNOSIS — I4811 Longstanding persistent atrial fibrillation: Secondary | ICD-10-CM | POA: Diagnosis not present

## 2020-05-28 NOTE — Addendum Note (Signed)
Addended by: Baird Lyons on: 05/28/2020 12:39 PM   Modules accepted: Orders

## 2020-05-28 NOTE — Progress Notes (Signed)
Electrophysiology Office Note   Date:  05/28/2020   ID:  Douglas Cowan, DOB 08/04/1945, MRN [id number]  PCP:  Waldon Merl, PA-C  Cardiologist:  Izora Ribas Primary Electrophysiologist:  Tion Tse Jorja Loa, MD    Chief Complaint: AF   History of Present Illness: Douglas Cowan is a 75 y.o. male who is being seen today for the evaluation of AF at the request of Izora Ribas, Mahesh A*. Presenting today for electrophysiology evaluation.  He has a history significant for coronary artery disease with stenting in 2011, atrial flutter status post ablation, longstanding persistent atrial fibrillation, diabetes, morbid obesity.  He has a history of longstanding persistent atrial fibrillation.  He was told that he was in atrial fibrillation after his atrial flutter ablation.  He is now status post cardioversion.  He has quite a bit less shortness of breath and quite a bit more energy.  He works as a Agricultural consultant at [employer], and says that he has an easier time transporting patients.  Today, he denies symptoms of palpitations, chest pain, shortness of breath, orthopnea, PND, lower extremity edema, claudication, dizziness, presyncope, syncope, bleeding, or neurologic sequela. The patient is tolerating medications without difficulties.    Past Medical History:  Diagnosis Date  . Abdominal aortic aneurysm (AAA) (HCC)   . Anxiety   . Arthritis   . BBB (bundle branch block)   . CAD (coronary artery disease)   . CHF (congestive heart failure) (HCC)   . COPD (chronic obstructive pulmonary disease) (HCC)   . CPAP (continuous positive airway pressure) dependence   . Depression   . DM (diabetes mellitus) (HCC)   . DOE (dyspnea on exertion)   . HLD (hyperlipidemia)   . Hypertension   . Obesity   . Orthopnea   . OSA (obstructive sleep apnea)   . Sleep apnea   . SOB (shortness of breath)   . Thyroid disease    Past Surgical History:  Procedure Laterality Date  . CARDIOVERSION N/A  05/24/2020   Procedure: CARDIOVERSION;  Surgeon: Christell Constant, MD;  Location: [facility];  Service: Cardiovascular;  Laterality: N/A;  . CORONARY ANGIOPLASTY WITH STENT PLACEMENT    . VASECTOMY       Current Outpatient Medications  Medication Sig Dispense Refill  . ACCU-CHEK GUIDE test strip     . Accu-Chek Softclix Lancets lancets 1 each 3 (three) times daily.    Marland Kitchen acetaminophen (TYLENOL) 500 MG tablet Take 500 mg by mouth every 6 (six) hours as needed for moderate pain.    Marland Kitchen albuterol (VENTOLIN HFA) 108 (90 Base) MCG/ACT inhaler Inhale 2 puffs into the lungs every 6 (six) hours as needed for wheezing or shortness of breath.    . Dulaglutide (TRULICITY) 1.5 MG/0.5ML SOPN Inject 1.5 mg once weekly for 4 weeks then increase to 3 mg weekly 15 mL 3  . ELIQUIS 5 MG TABS tablet TAKE 1 TABLET BY MOUTH TWICE A DAY 180 tablet 0  . furosemide (LASIX) 20 MG tablet TAKE 1 TABLET BY MOUTH EVERY DAY IN THE MORNING 90 tablet 0  . KLOR-CON M10 10 MEQ tablet TAKE 1 TABLET BY MOUTH EVERY DAY 90 tablet 0  . levothyroxine (SYNTHROID) 50 MCG tablet Take 1 tablet (50 mcg total) by mouth daily. 30 tablet 1  . metFORMIN (GLUCOPHAGE) 500 MG tablet TAKE 2 TABLETS (1,000 MG TOTAL) BY MOUTH 2 (TWO) TIMES DAILY AS NEEDED. **MUST ESTABLISH LOCAL PCP** 120 tablet 1  . metoprolol (TOPROL-XL) 200 MG 24 hr tablet TAKE  1 TABLET BY MOUTH NIGHTLY 90 tablet 0  . Multiple Vitamin (MULTIVITAMIN) capsule Take 1 capsule by mouth daily.    . rosuvastatin (CRESTOR) 40 MG tablet TAKE 1 TABLET BY MOUTH EVERY DAY 90 tablet 0  . sacubitril-valsartan (ENTRESTO) 24-26 MG Take 1 tablet by mouth 2 (two) times daily.    Chelsea Aus ER 120 MG 24 hr capsule TAKE 1 CAPSULE (120 MG TOTAL) BY MOUTH NIGHTLY. 90 capsule 0  . tiotropium (SPIRIVA) 18 MCG inhalation capsule Place 18 mcg into inhaler and inhale daily.     No current facility-administered medications for this visit.    Allergies:   Lipitor [atorvastatin]   Social History:   The patient  reports that he has quit smoking. His smoking use included cigarettes and pipe. He has never used smokeless tobacco. He reports current alcohol use of about 20.0 standard drinks of alcohol per week. He reports that he does not use drugs.   Family History:  The patient's family history includes Arthritis in his sister; Birth defects in his brother and sister; Cancer in his father; Depression in his mother; Diabetes in his father; Early death in his brother and father; Heart disease in his brother, mother, and sister; Hyperlipidemia in his mother; Stroke in his mother.    ROS:  Please see the history of present illness.   Otherwise, review of systems is positive for none.   All other systems are reviewed and negative.    PHYSICAL EXAM: VS:  BP 106/68   Pulse (!) 59   Ht 5\' 10"  (1.778 m)   Wt 280 lb (127 kg)   SpO2 95%   BMI 40.18 kg/m  , BMI Body mass index is 40.18 kg/m. GEN: Well nourished, well developed, in no acute distress  HEENT: normal  Neck: no JVD, carotid bruits, or masses Cardiac: RRR; no murmurs, rubs, or gallops,no edema  Respiratory:  clear to auscultation bilaterally, normal work of breathing GI: soft, nontender, nondistended, + BS MS: no deformity or atrophy  Skin: warm and dry Neuro:  Strength and sensation are intact Psych: euthymic mood, full affect  EKG:  EKG is ordered today. Personal review of the ekg ordered shows sinus rhythm  Recent Labs: 03/15/2020: NT-Pro BNP 256 04/11/2020: ALT 49; TSH 6.35 04/16/2020: Magnesium 1.9 05/04/2020: Platelets 229 05/24/2020: BUN 15; Creatinine, Ser 1.00; Hemoglobin 15.3; Potassium 4.0; Sodium 140    Lipid Panel     Component Value Date/Time   CHOL 135 04/11/2020 0927   TRIG 178.0 (H) 04/11/2020 0927   HDL 41.50 04/11/2020 0927   CHOLHDL 3 04/11/2020 0927   VLDL 35.6 04/11/2020 0927   LDLCALC 58 04/11/2020 0927     Wt Readings from Last 3 Encounters:  05/28/20 280 lb (127 kg)  05/24/20 283 lb 15.2  oz (128.8 kg)  05/09/20 284 lb (128.8 kg)      Other studies Reviewed: Additional studies/ records that were reviewed today include: TTE 05/04/20  Review of the above records today demonstrates:  1. Left ventricular ejection fraction, by estimation, is 50% with beat to  beat variability. The left ventricle has low normal function. Left  ventricular endocardial border not optimally defined to evaluate regional  wall motion despite the use of  Definity. Inferior wall appears akinetic in short axis, inferolateral wall  likely severely hypokinetic. There is mild left ventricular hypertrophy.  Left ventricular diastolic parameters are indeterminate.  2. Right ventricular systolic function is mildly reduced. The right  ventricular size is moderately enlarged.  There is borderline mild  pulmonary artery systolic pressure. The estimated right ventricular  systolic pressure is 35.3 mmHg.  3. Left atrial size was mild to moderately dilated.  4. Mitral valve regurgitation likely eccentric and therefore may be  underestimated, with evidence of splay artifact. MR is likely  mild-moderate to moderate. The mitral valve is degenerative. Mild to  moderate mitral valve regurgitation. No evidence of  mitral stenosis. Moderate mitral annular calcification.  5. The aortic valve is grossly normal. There is mild calcification of the  aortic valve. Aortic valve regurgitation is not visualized.  6. The inferior vena cava is normal in size with greater than 50%  respiratory variability, suggesting right atrial pressure of 3 mmHg.    ASSESSMENT AND PLAN:  1. longstanding persistent atrial fibrillation/atrial flutter: Currently on Eliquis, diltiazem.  CHA2DS2-VASc of at least 4.  He has had an atrial flutter ablation.  He has been according to the patient, in atrial fibrillation since then.  I did offer loading on either amiodarone or dofetilide.  At this point, he would like to avoid further medications.   He would like to plan for ablation.  Risks and benefits have been discussed which include bleeding, tamponade, heart block, stroke, damage to chest organs.  He understands these risks and has agreed to the procedure.  2.  Coronary artery disease: No current chest pain.  Plan per primary cardiology.  3.  Chronic systolic heart failure: NYHA class II.  Currently on optimal medical therapy per primary cardiology.  Case discussed with primary cardiology  Current medicines are reviewed at length with the patient today.   The patient does not have concerns regarding his medicines.  The following changes were made today:  none  Labs/ tests ordered today include:  Orders Placed This Encounter  Procedures  . CT CARDIAC MORPH/PULM VEIN W/CM&W/O CA SCORE  . Basic metabolic panel  . CBC  . EKG 12-Lead     Disposition:   FU with Sheri Gatchel 3 months  Signed, Mychael Smock Jorja Loa, MD  05/28/2020 12:05 PM     [facility] [address] [place] [address] [phone] (office) [phone] (fax)

## 2020-05-28 NOTE — Patient Instructions (Addendum)
CPAP not working  Medication Instructions:  Your physician recommends that you continue on your current medications as directed. Please refer to the Current Medication list given to you today.  *If you need a refill on your cardiac medications before your next appointment, please call your pharmacy*   Lab Work: Pre procedure labs on 07/09/20 If you have labs (blood work) drawn today and your tests are completely normal, you will receive your results only by: Marland Kitchen MyChart Message (if you have MyChart) OR . A paper copy in the mail If you have any lab test that is abnormal or we need to change your treatment, we will call you to review the results.   Testing/Procedures: Your physician has requested that you have cardiac CT within 7 days PRIOR to your ablation. Cardiac computed tomography (CT) is a painless test that uses an x-ray machine to take clear, detailed pictures of your heart.  Please follow instructions below located under "other instructions".   Your physician has recommended that you have an ablation. Catheter ablation is a medical procedure used to treat some cardiac arrhythmias (irregular heartbeats). During catheter ablation, a long, thin, flexible tube is put into a blood vessel in your groin (upper thigh), or neck. This tube is called an ablation catheter. It is then guided to your heart through the blood vessel. Radio frequency waves destroy small areas of heart tissue where abnormal heartbeats may cause an arrhythmia to start. Please follow instructions below located under "other instructions".   Follow-Up: At [facility], you and your health needs are our priority.  As part of our continuing mission to provide you with exceptional heart care, we have created designated Provider Care Teams.  These Care Teams include your primary Cardiologist (physician) and Advanced Practice Providers (APPs -  Physician Assistants and Nurse Practitioners) who all work together to provide you with  the care you need, when you need it.  You have been referred to Dr. Radford Pax for CPAP management.   Your next appointment:   4 week(s) after your ablation  The format for your next appointment:   In Person  Provider:   You will follow up in the [facility] located at [facility]. Your provider will be: Roderic Palau, NP or Clint R. Fenton, PA-C    Thank you for choosing [facility]!!   Trinidad Curet, RN [phone]   Other Instructions  CT INSTRUCTIONS Your cardiac CT will be scheduled at:  [facility] [address] [place], [place] [address] [phone]  Please arrive at the [facility] main entrance of [facility] 30 minutes prior to test start time. Proceed to the [facility] Radiology Department (first floor) to check-in and test prep.  Please follow these instructions carefully (unless otherwise directed):  Hold all erectile dysfunction medications at least 3 days (72 hrs) prior to test.  On the Night Before the Test: . Be sure to Drink plenty of water. . Do not consume any caffeinated/decaffeinated beverages or chocolate 12 hours prior to your test. . Do not take any antihistamines 12 hours prior to your test.  On the Day of the Test: . Drink plenty of water. Do not drink any water within one hour of the test. . Do not eat any food 4 hours prior to the test. . You may take your regular medications prior to the test.  . HOLD Furosemide/Hydrochlorothiazide morning of the test.  After the Test: . Drink plenty of water. Marland Kitchen  After receiving IV contrast, you may experience a mild flushed feeling. This is normal. . On occasion, you may experience a mild rash up to 24 hours after the test. This is not dangerous. If this occurs, you can take Benadryl 25 mg and increase your fluid intake. . If you experience trouble breathing, this can be serious. If it is severe call 911 IMMEDIATELY. If it is mild, please call our  office. . If you take any of these medications: Glipizide/Metformin, Avandament, Glucavance, please do not take 48 hours after completing test unless otherwise instructed.   Once we have confirmed authorization from your insurance company, we will call you to set up a date and time for your test. Based on how quickly your insurance processes prior authorizations requests, please allow up to 4 weeks to be contacted for scheduling your Cardiac CT appointment. Be advised that routine Cardiac CT appointments could be scheduled as many as 8 weeks after your provider has ordered it.  For non-scheduling related questions, please contact the cardiac imaging nurse navigator should you have any questions/concerns: Rockwell Alexandria, Cardiac Imaging Nurse Navigator Mitzi Hansen, Interim Cardiac Imaging Nurse Navigator [facility] Direct Office Dial: [phone]   For scheduling needs, including cancellations and rescheduling, please call [place], [phone].     Electrophysiology/Ablation Procedure Instructions   You are scheduled for a(n)  ablation on 07/26/20 with Dr. Loman Brooklyn.   1.   Pre procedure testing-             A.  LAB WORK --- On 07/09/20  for your pre procedure blood work. You  Do NOT need to be fasting.  You can stop by the office between 7:30 am - 4:30 pm               B. COVID TEST-- On 07/24/20 @ 10:00 am - This is a Drive Up Visit at [address] [place], [place] [address].  Someone will direct you to the appropriate testing line. Stay in your car and someone will be with you shortly.   After you are tested please go home and self quarantine until the day of your procedure.     2. On the day of your procedure 07/26/20 you will go to [facility] [phone]) at 5:30 am.  Bonita Quin will go to the main entrance [employer]) and enter where the [employer] are.  Your driver will drop you off and you will head down the hallway to ADMITTING.   You may have one support person come in to the hospital with you.  They will be asked to wait in the waiting room.   3.   Do not eat or drink after midnight prior to your procedure.   4.   Do not miss any doses of your blood thinner prior to the morning of your procedure or your procedure will need to be rescheduled.       Do NOT take any medications the morning of your procedure.   5.  Plan for an overnight stay, but you may be discharged home after your procedure.    If you use your phone frequently bring your phone charger, in case you have to stay.  If you are discharged after your procedure you will need someone to drive you home and be with your for 24 hours after your procedure.   6. You will follow up with the AFIB clinic 4 weeks after your  procedure.  You will follow up with Dr. Curt Bears  3 months after your procedure.  These appointments will be made for you.   * If you have ANY questions please call the office [phone] and ask for Hedda Crumbley RN or send me a MyChart message   * Occasionally, EP Studies and ablations can become lengthy.  Please make your family aware of this before your procedure starts.  Average time ranges from 2-8 hours for EP studies/ablations.  Your physician will call your family after the procedure with the results.                                    Cardiac Ablation Cardiac ablation is a procedure to destroy (ablate) some heart tissue that is sending bad signals. These bad signals cause problems in heart rhythm. The heart has many areas that make these signals. If there are problems in these areas, they can make the heart beat in a way that is not normal. Destroying some tissues can help make the heart rhythm normal. Tell your doctor about:  Any allergies you have.  All medicines you are taking. These include vitamins, herbs, eye drops, creams, and over-the-counter medicines.  Any problems you or family members have had with medicines that make you fall  asleep (anesthetics).  Any blood disorders you have.  Any surgeries you have had.  Any medical conditions you have, such as kidney failure.  Whether you are pregnant or may be pregnant. What are the risks? This is a safe procedure. But problems may occur, including:  Infection.  Bruising and bleeding.  Bleeding into the chest.  Stroke or blood clots.  Damage to nearby areas of your body.  Allergies to medicines or dyes.  The need for a pacemaker if the normal system is damaged.  Failure of the procedure to treat the problem. What happens before the procedure? Medicines Ask your doctor about:  Changing or stopping your normal medicines. This is important.  Taking aspirin and ibuprofen. Do not take these medicines unless your doctor tells you to take them.  Taking other medicines, vitamins, herbs, and supplements. General instructions  Follow instructions from your doctor about what you cannot eat or drink.  Plan to have someone take you home from the hospital or clinic.  If you will be going home right after the procedure, plan to have someone with you for 24 hours.  Ask your doctor what steps will be taken to prevent infection. What happens during the procedure?  An IV tube will be put into one of your veins.  You will be given a medicine to help you relax.  The skin on your neck or groin will be numbed.  A cut (incision) will be made in your neck or groin. A needle will be put through your cut and into a large vein.  A tube (catheter) will be put into the needle. The tube will be moved to your heart.  Dye may be put through the tube. This helps your doctor see your heart.  Small devices (electrodes) on the tube will send out signals.  A type of energy will be used to destroy some heart tissue.  The tube will be taken out.  Pressure will be held on your cut. This helps stop bleeding.  A bandage will be put over your cut. The exact procedure may vary  among doctors and hospitals.  What happens after the procedure?  You will be watched until you leave the hospital or clinic. This includes checking your heart rate, breathing rate, oxygen, and blood pressure.  Your cut will be watched for bleeding. You will need to lie still for a few hours.  Do not drive for 24 hours or as long as your doctor tells you. Summary  Cardiac ablation is a procedure to destroy some heart tissue. This is done to treat heart rhythm problems.  Tell your doctor about any medical conditions you may have. Tell him or her about all medicines you are taking to treat them.  This is a safe procedure. But problems may occur. These include infection, bruising, bleeding, and damage to nearby areas of your body.  Follow what your doctor tells you about food and drink. You may also be told to change or stop some of your medicines.  After the procedure, do not drive for 24 hours or as long as your doctor tells you. This information is not intended to replace advice given to you by your health care provider. Make sure you discuss any questions you have with your health care provider. Document Revised: 04/07/2019 Document Reviewed: 04/07/2019 Elsevier Patient Education  2021 [employer].   Dofetilide capsules What is this medicine? DOFETILIDE (doe FET il ide) is an antiarrhythmic drug. It helps make your heart beat regularly. This medicine also helps to slow rapid heartbeats. This medicine may be used for other purposes; ask your health care provider or pharmacist if you have questions. COMMON BRAND NAME(S): Tikosyn What should I tell my health care provider before I take this medicine? They need to know if you have any of these conditions:  heart disease  history of irregular heartbeat  history of low levels of potassium or magnesium in the blood  kidney disease  liver disease  an unusual or allergic reaction to dofetilide, other medicines, foods, dyes, or  preservatives  pregnant or trying to get pregnant  breast-feeding How should I use this medicine? Take this medicine by mouth with a glass of water. Follow the directions on the prescription label. Do not take with grapefruit juice. You can take it with or without food. If it upsets your stomach, take it with food. Take your medicine at regular intervals. Do not take it more often than directed. Do not stop taking except on your doctor's advice. A special MedGuide will be given to you by the pharmacist with each prescription and refill. Be sure to read this information carefully each time. Talk to your pediatrician regarding the use of this medicine in children. Special care may be needed. Overdosage: If you think you have taken too much of this medicine contact a poison control center or emergency room at once. NOTE: This medicine is only for you. Do not share this medicine with others. What if I miss a dose? If you miss a dose, skip it. Take your next dose at the normal time. Do not take extra or 2 doses at the same time to make up for the missed dose. What may interact with this medicine? Do not take this medicine with any of the following medications:  cimetidine  cisapride  dolutegravir  dronedarone  erdafitinib  hydrochlorothiazide  ketoconazole  megestrol  pimozide  prochlorperazine  thioridazine  trimethoprim  verapamil This medicine may also interact with the following medications:  amiloride  cannabinoids  certain antibiotics like erythromycin or clarithromycin  certain antiviral medicines for HIV or hepatitis  certain medicines for depression, anxiety, or psychotic disorders  digoxin  diltiazem  grapefruit juice  metformin  nefazodone  other medicines that prolong the QT interval (an abnormal heart rhythm)  quinine  triamterene  zafirlukast  ziprasidone This list may not describe all possible interactions. Give your health care  provider a list of all the medicines, herbs, non-prescription drugs, or dietary supplements you use. Also tell them if you smoke, drink alcohol, or use illegal drugs. Some items may interact with your medicine. What should I watch for while using this medicine? Your condition will be monitored carefully while you are receiving this medicine. What side effects may I notice from receiving this medicine? Side effects that you should report to your doctor or health care professional as soon as possible:  allergic reactions like skin rash, itching or hives, swelling of the face, lips, or tongue  breathing problems  chest pain or chest tightness  dizziness  signs and symptoms of a dangerous change in heartbeat or heart rhythm like chest pain; dizziness; fast or irregular heartbeat; palpitations; feeling faint or lightheaded, falls; breathing problems  signs and symptoms of electrolyte imbalance like severe diarrhea, unusual sweating, vomiting, loss of appetite, increased thirst  swelling of the ankles, legs, or feet  tingling, numbness in the hands or feet Side effects that usually do not require medical attention (report to your doctor or health care professional if they continue or are bothersome):  diarrhea  general ill feeling or flu-like symptoms  headache  nausea  trouble sleeping  stomach pain This list may not describe all possible side effects. Call your doctor for medical advice about side effects. You may report side effects to FDA at 1-800-FDA-1088. Where should I keep my medicine? Keep out of the reach of children. Store at room temperature between 15 and 30 degrees C (59 and 86 degrees F). Throw away any unused medicine after the expiration date. NOTE: This sheet is a summary. It may not cover all possible information. If you have questions about this medicine, talk to your doctor, pharmacist, or health care provider.  2021 Elsevier/Gold Standard (2019-04-05  12:14:05)

## 2020-06-06 NOTE — Telephone Encounter (Signed)
APPROVED: Per Suzette Battiest T at [facility]:                       Start date 07/12/20- End date 08/11/20 Auth# [id number]

## 2020-06-15 ENCOUNTER — Ambulatory Visit: Payer: Medicare PPO | Admitting: Internal Medicine

## 2020-06-15 ENCOUNTER — Other Ambulatory Visit: Payer: Self-pay

## 2020-06-15 ENCOUNTER — Encounter: Payer: Self-pay | Admitting: Internal Medicine

## 2020-06-15 VITALS — BP 100/58 | HR 56 | Ht 70.0 in | Wt 275.0 lb

## 2020-06-15 DIAGNOSIS — I2510 Atherosclerotic heart disease of native coronary artery without angina pectoris: Secondary | ICD-10-CM

## 2020-06-15 DIAGNOSIS — I4811 Longstanding persistent atrial fibrillation: Secondary | ICD-10-CM

## 2020-06-15 DIAGNOSIS — E119 Type 2 diabetes mellitus without complications: Secondary | ICD-10-CM | POA: Diagnosis not present

## 2020-06-15 DIAGNOSIS — I714 Abdominal aortic aneurysm, without rupture, unspecified: Secondary | ICD-10-CM

## 2020-06-15 DIAGNOSIS — I5020 Unspecified systolic (congestive) heart failure: Secondary | ICD-10-CM

## 2020-06-15 DIAGNOSIS — I10 Essential (primary) hypertension: Secondary | ICD-10-CM

## 2020-06-15 LAB — BASIC METABOLIC PANEL
BUN/Creatinine Ratio: 13 (ref 10–24)
BUN: 12 mg/dL (ref 8–27)
CO2: 23 mmol/L (ref 20–29)
Calcium: 9.3 mg/dL (ref 8.6–10.2)
Chloride: 104 mmol/L (ref 96–106)
Creatinine, Ser: 0.91 mg/dL (ref 0.76–1.27)
GFR calc Af Amer: 97 mL/min/{1.73_m2} (ref >59)
GFR calc non Af Amer: 84 mL/min/{1.73_m2} (ref >59)
Glucose: 166 mg/dL — ABNORMAL HIGH (ref 65–99)
Potassium: 4.7 mmol/L (ref 3.5–5.2)
Sodium: 141 mmol/L (ref 134–144)

## 2020-06-15 NOTE — Progress Notes (Signed)
Cardiology Office Note:    Date:  06/15/2020   ID:  Douglas Cowan, DOB 09-24-1945, MRN [id number]  [facility] Cardiologist:  Marcelline Mates PA-C  CC: Follow up CP  History of Present Illness:    Douglas Cowan is a 75 y.o. male with a hx of DM,  CAD with prior stents in 2011, LCP 01/03/20 at [facility] with D2 80% (bifurcation lesion) prox LCx 60%.  Prior atrial flutter with ablation 8 years ago complicated by asystole per patient with long standing persistent atrial fibrillation CHADSVASC 4, question of HFrEF EF 35-40% with inferior WMA and severe LA dilation ([facility]) 01/02/20.   Diabetes, Morbid Obesity.  Presented to establish care 03/15/20. Presented to DOD 03/27/20 with CP.  Stopped Entresto for low BP only briefly.  Had DCCV 05/24/20 with successful conversion to SR.  Saw Dr. Elberta Fortis and is being evaluation for ablation.  Patient notes that he is doing Ok.  Since last visit notes resolution of chest pain.  Relevant interval testing or therapy include EP .  There are no interval hospital/ED visit.    No chest pain or pressure .  No SOB/DOE and no PND/Orthopnea.  No weight gain or leg swelling.  No palpitations or syncope .  Ambulatory blood pressure 100/58 without symtoms.   Past Medical History:  Diagnosis Date  . Abdominal aortic aneurysm (AAA) (HCC)   . Anxiety   . Arthritis   . BBB (bundle branch block)   . CAD (coronary artery disease)   . CHF (congestive heart failure) (HCC)   . COPD (chronic obstructive pulmonary disease) (HCC)   . CPAP (continuous positive airway pressure) dependence   . Depression   . DM (diabetes mellitus) (HCC)   . DOE (dyspnea on exertion)   . HLD (hyperlipidemia)   . Hypertension   . Obesity   . Orthopnea   . OSA (obstructive sleep apnea)   . Sleep apnea   . SOB (shortness of breath)   . Thyroid disease     Past Surgical History:  Procedure Laterality Date  . CARDIOVERSION N/A 05/24/2020   Procedure:  CARDIOVERSION;  Surgeon: Christell Constant, MD;  Location: [facility];  Service: Cardiovascular;  Laterality: N/A;  . CORONARY ANGIOPLASTY WITH STENT PLACEMENT    . VASECTOMY      Current Medications: Current Meds  Medication Sig  . ACCU-CHEK GUIDE test strip   . Accu-Chek Softclix Lancets lancets 1 each 3 (three) times daily.  Marland Kitchen acetaminophen (TYLENOL) 500 MG tablet Take 500 mg by mouth every 6 (six) hours as needed for moderate pain.  Marland Kitchen albuterol (VENTOLIN HFA) 108 (90 Base) MCG/ACT inhaler Inhale 2 puffs into the lungs every 6 (six) hours as needed for wheezing or shortness of breath.  . Dulaglutide (TRULICITY) 1.5 MG/0.5ML SOPN Inject 1.5 mg once weekly for 4 weeks then increase to 3 mg weekly  . ELIQUIS 5 MG TABS tablet TAKE 1 TABLET BY MOUTH TWICE A DAY  . furosemide (LASIX) 20 MG tablet TAKE 1 TABLET BY MOUTH EVERY DAY IN THE MORNING  . levothyroxine (SYNTHROID) 50 MCG tablet Take 1 tablet (50 mcg total) by mouth daily.  . metFORMIN (GLUCOPHAGE) 500 MG tablet TAKE 2 TABLETS (1,000 MG TOTAL) BY MOUTH 2 (TWO) TIMES DAILY AS NEEDED. **MUST ESTABLISH LOCAL PCP**  . metoprolol (TOPROL-XL) 200 MG 24 hr tablet TAKE 1 TABLET BY MOUTH NIGHTLY  . Multiple Vitamin (MULTIVITAMIN) capsule Take 1 capsule by mouth daily.  . rosuvastatin (CRESTOR) 40  MG tablet TAKE 1 TABLET BY MOUTH EVERY DAY  . sacubitril-valsartan (ENTRESTO) 24-26 MG Take 1 tablet by mouth 2 (two) times daily.  Marland Kitchen tiotropium (SPIRIVA) 18 MCG inhalation capsule Place 18 mcg into inhaler and inhale daily.  . [DISCONTINUED] KLOR-CON M10 10 MEQ tablet TAKE 1 TABLET BY MOUTH EVERY DAY  . [DISCONTINUED] TIADYLT ER 120 MG 24 hr capsule TAKE 1 CAPSULE (120 MG TOTAL) BY MOUTH NIGHTLY.    Allergies:   Lipitor [atorvastatin]   Social History   Socioeconomic History  . Marital status: Married    Spouse name: Not on file  . Number of children: Not on file  . Years of education: Not on file  . Highest education level: Not on file   Occupational History  . Not on file  Tobacco Use  . Smoking status: Former Smoker    Types: Cigarettes, Pipe  . Smokeless tobacco: Never Used  Vaping Use  . Vaping Use: Never used  Substance and Sexual Activity  . Alcohol use: Yes    Alcohol/week: 20.0 standard drinks    Types: 14 Glasses of wine, 6 Cans of beer per week  . Drug use: Never  . Sexual activity: Yes    Birth control/protection: Surgical    Comment: Vascetomy  Other Topics Concern  . Not on file  Social History Narrative  . Not on file   Social Determinants of Health   Financial Resource Strain: Not on file  Food Insecurity: Not on file  Transportation Needs: Not on file  Physical Activity: Not on file  Stress: Not on file  Social Connections: Not on file    Family History: The patient's notes heart disease in her family Sister had valve replacement with at age the age of 45 Brother had prior valve repair NOS  ROS:   Please see the history of present illness.    All other systems reviewed and are negative.  EKGs/Labs/Other Studies Reviewed:    The following studies were reviewed today:  EKG:   06/15/20: Sinus bradycardia 56 RBBB  Transthoracic Echocardiogram: Date: Improvement in LVEF with atrial functional MR Results: 1. Left ventricular ejection fraction, by estimation, is 50% with beat to  beat variability. The left ventricle has low normal function. Left  ventricular endocardial border not optimally defined to evaluate regional  wall motion despite the use of  Definity. Inferior wall appears akinetic in short axis, inferolateral wall  likely severely hypokinetic. There is mild left ventricular hypertrophy.  Left ventricular diastolic parameters are indeterminate.  2. Right ventricular systolic function is mildly reduced. The right  ventricular size is moderately enlarged. There is borderline mild  pulmonary artery systolic pressure. The estimated right ventricular  systolic pressure is  35.3 mmHg.  3. Left atrial size was mild to moderately dilated.  4. Mitral valve regurgitation likely eccentric and therefore may be  underestimated, with evidence of splay artifact. MR is likely  mild-moderate to moderate. The mitral valve is degenerative. Mild to  moderate mitral valve regurgitation. No evidence of  mitral stenosis. Moderate mitral annular calcification.  5. The aortic valve is grossly normal. There is mild calcification of the  aortic valve. Aortic valve regurgitation is not visualized.  6. The inferior vena cava is normal in size with greater than 50%  respiratory variability, suggesting right atrial pressure of 3 mmHg.   August 2021 Outside Films Personal Reviewed CD: [facility] [place] L[facility] and Echo- has 80% ostial D2 lesion, medium sized lesion.  EF 30-35% moderate  LA.  Would be difficult intervention.  Left Right Heart Catheterizations: Date: August 2021 Outside Films Results: Old Records from [place] [facility] [place] August 2021. LCP [facility]/03/20 at [facility] with D2 80% prox LCx 60%.  Prior atrial flutter with ablation 8 years ago complicated by asystole per patient with long standing persistent atrial fibrillation CHADSVASC 4.    Physical Exam:    VS:  BP (!) 100/58   Pulse (!) 56   Ht 5\' 10"  (1.778 m)   Wt 275 lb (124.7 kg)   SpO2 96%   BMI 39.46 kg/m     Wt Readings from Last 3 Encounters:  06/15/20 275 lb (124.7 kg)  05/28/20 280 lb (127 kg)  05/24/20 283 lb 15.2 oz (128.8 kg)    GEN:  Obese, well developed in no acute distress HEENT: Normal NECK: No JVD; No carotid bruits LYMPHATICS: No lymphadenopathy CARDIAC: Regular bradycardia, no murmurs, rubs, gallops RESPIRATORY:  Clear to auscultation without rales, wheezing or rhonchi  ABDOMEN: Soft, non-tender, non-distended MUSCULOSKELETAL:  No edema; No deformity  SKIN: Warm and dry NEUROLOGIC:  Alert and oriented x 3 PSYCHIATRIC:  Normal affect   ASSESSMENT:    1. Longstanding  persistent atrial fibrillation (HCC)   2. Coronary artery disease involving native coronary artery of native heart without angina pectoris   3. Morbid obesity (HCC)   4. Diabetes mellitus with coincident hypertension (HCC)   5. HFrEF (heart failure with reduced ejection fraction) (HCC)   6. Abdominal aortic aneurysm (AAA) without rupture (HCC)    PLAN:    In order of problems listed above:  Chronic Heart Failure Recovered EF Morbid Obesity DM with HTN - NYHA class II, Stage B, slightly euvolemic, etiology from CAD vs AF mixed pictures - Will continue lasix 40 mg PO daily - Strict I/Os, daily weights, and fluid restriction of < 2 L  - Continue Toprol Xl 100 mg - Will check BMP; low threshold to start SGLT2i if sugars are up or aldactone if K is low - will clean up med list (patient not takign dilt or K)  Coronary Artery Disease and HLD Anatomy: Obstructive in Diag and OM2 borderline but technically difficult (discussed with IC i in 2021) - Asymptomatic - Continue Eliquis, so no aspirin - continue statin - continue BB as above  Long Standing, Persistent Atrial Fibrillation CHADSVASC 4 Daytime Somnolense  - BB and AC as above - OSA Eval; Epworth score of 18; pending - minimal alcohol - seeing EP for possible ablation  Abdominal Aortic Aneurysm - Patient notes this from prior history in 2022 but has not been followed since - will get once time duplex  3 months follow up unless new symptoms or abnormal test results warranting change in plan  Would be reasonable for  APP Follow up   Medication Adjustments/Labs and Tests Ordered: Current medicines are reviewed at length with the patient today.  Concerns regarding medicines are outlined above.  Orders Placed This Encounter  Procedures  . Basic metabolic panel  . EKG 12-Lead  . VAS [place] AAA DUPLEX   No orders of the defined types were placed in this encounter.   Patient Instructions  Medication Instructions:  STOP:   Taking Potassium Chloride and Tiadylt NOW *If you need a refill on your cardiac medications before your next appointment, please call your pharmacy*   Lab Work: TODAY: BMP  Testing/Procedures: Your physician has requested that you have an abdominal aorta duplex. During this test, an ultrasound is used  to evaluate the aorta. Allow 30 minutes for this exam. Do not eat after midnight the day before and avoid carbonated beverages    Follow-Up: At [facility], you and your health needs are our priority.  As part of our continuing mission to provide you with exceptional heart care, we have created designated Provider Care Teams.  These Care Teams include your primary Cardiologist (physician) and Advanced Practice Providers (APPs -  Physician Assistants and Nurse Practitioners) who all work together to provide you with the care you need, when you need it.   Your next appointment:   3 month(s)  The format for your next appointment:   In Person  Provider:   You may see Dr. Ronette Deter or one of the following Advanced Practice Providers on your designated Care Team:    Ronie Spies, PA-C  Jacolyn Reedy, PA-C         Signed, Christell Constant, MD  06/15/2020 12:21 PM    [facility]

## 2020-06-15 NOTE — Patient Instructions (Signed)
Medication Instructions:  STOP:  Taking Potassium Chloride and Tiadylt NOW *If you need a refill on your cardiac medications before your next appointment, please call your pharmacy*   Lab Work: TODAY: BMP  Testing/Procedures: Your physician has requested that you have an abdominal aorta duplex. During this test, an ultrasound is used to evaluate the aorta. Allow 30 minutes for this exam. Do not eat after midnight the day before and avoid carbonated beverages    Follow-Up: At [facility], you and your health needs are our priority.  As part of our continuing mission to provide you with exceptional heart care, we have created designated Provider Care Teams.  These Care Teams include your primary Cardiologist (physician) and Advanced Practice Providers (APPs -  Physician Assistants and Nurse Practitioners) who all work together to provide you with the care you need, when you need it.   Your next appointment:   3 month(s)  The format for your next appointment:   In Person  Provider:   You may see Dr. Ronette Deter or one of the following Advanced Practice Providers on your designated Care Team:    Ronie Spies, PA-C  Jacolyn Reedy, PA-C

## 2020-06-16 ENCOUNTER — Other Ambulatory Visit: Payer: Self-pay | Admitting: Physician Assistant

## 2020-06-16 DIAGNOSIS — E039 Hypothyroidism, unspecified: Secondary | ICD-10-CM

## 2020-06-18 NOTE — Telephone Encounter (Signed)
Patient is due for a recheck of TSH level. Last level was abnormal

## 2020-06-19 ENCOUNTER — Other Ambulatory Visit: Payer: Self-pay | Admitting: Physician Assistant

## 2020-06-21 NOTE — Telephone Encounter (Signed)
Patient is scheduled for lab study on 07/17/20. Patient understands his sleep study will be done at [facility] sleep lab. Patient understands he will receive a sleep packet in a week or so. Patient understands to call if he does not receive the sleep packet in a timely manner. Patient agrees with treatment and thanked me for call.

## 2020-06-25 ENCOUNTER — Other Ambulatory Visit: Payer: Self-pay

## 2020-06-25 ENCOUNTER — Ambulatory Visit (HOSPITAL_COMMUNITY)
Admission: RE | Admit: 2020-06-25 | Discharge: 2020-06-25 | Disposition: A | Discharge status: Home / Self Care | Payer: Medicare PPO | Source: Ambulatory Visit | Attending: Cardiology | Admitting: Cardiology

## 2020-06-25 DIAGNOSIS — I714 Abdominal aortic aneurysm, without rupture, unspecified: Secondary | ICD-10-CM

## 2020-06-29 ENCOUNTER — Other Ambulatory Visit: Payer: Self-pay

## 2020-07-03 ENCOUNTER — Telehealth: Payer: Medicare PPO | Admitting: Endocrinology

## 2020-07-03 ENCOUNTER — Other Ambulatory Visit: Payer: Self-pay

## 2020-07-03 ENCOUNTER — Encounter: Payer: Self-pay | Admitting: Endocrinology

## 2020-07-03 DIAGNOSIS — E785 Hyperlipidemia, unspecified: Secondary | ICD-10-CM

## 2020-07-03 DIAGNOSIS — R7989 Other specified abnormal findings of blood chemistry: Secondary | ICD-10-CM

## 2020-07-03 LAB — BASIC METABOLIC PANEL
BUN: 16 mg/dL (ref 6–23)
CO2: 29 mEq/L (ref 19–32)
Calcium: 9.9 mg/dL (ref 8.4–10.5)
Chloride: 100 mEq/L (ref 96–112)
Creatinine, Ser: 1.13 mg/dL (ref 0.40–1.50)
GFR: 65.01 mL/min (ref >60.00)
Glucose, Bld: 169 mg/dL — ABNORMAL HIGH (ref 70–99)
Potassium: 4.6 mEq/L (ref 3.5–5.1)
Sodium: 135 mEq/L (ref 135–145)

## 2020-07-03 LAB — TSH: TSH: 5.69 u[IU]/mL — ABNORMAL HIGH (ref 0.35–4.50)

## 2020-07-03 LAB — FOLLICLE STIMULATING HORMONE: FSH: 10 m[IU]/mL (ref 1.4–18.1)

## 2020-07-03 LAB — T4, FREE: Free T4: 0.79 ng/dL (ref 0.60–1.60)

## 2020-07-03 LAB — LUTEINIZING HORMONE: LH: 4.76 m[IU]/mL (ref 3.10–34.60)

## 2020-07-03 NOTE — Progress Notes (Signed)
Subjective:    Patient ID: Douglas Cowan, male    DOB: 14-Mar-1946, 75 y.o.   MRN: [id number]  HPI  telehealth visit today via video visit.  Alternatives to telehealth are presented to this patient, and the patient agrees to the telehealth visit. Pt is advised of the cost of the visit, and agrees to this, also.   Patient is at our office, and I am at home.   Persons attending the telehealth visit: the patient and I Pt is referred by Marcelline Mates, PA,for hypogonadism.  Pt reports he had puberty at the normal age.  He has 2 biological children.  He says he has never taken illicit androgens.  He has never had pituitary imaging. He took depo-testosterone 2014-2015.  He does not take antiandrogens or opioids.  He denies any h/o infertility, XRT, or genital infection.  He has never had surgery, or a serious injury to the head or genital area. He has no h/o DVT.   He does not consume alcohol excessively.  He has h/o OSA.  He reports fatigue, weight gain, breast swelling, and ED.  No change in chronic sob.  Synthroid was increased a few mos ago.   Past Medical History:  Diagnosis Date  . Abdominal aortic aneurysm (AAA) (HCC)   . Anxiety   . Arthritis   . BBB (bundle branch block)   . CAD (coronary artery disease)   . CHF (congestive heart failure) (HCC)   . COPD (chronic obstructive pulmonary disease) (HCC)   . CPAP (continuous positive airway pressure) dependence   . Depression   . DM (diabetes mellitus) (HCC)   . DOE (dyspnea on exertion)   . HLD (hyperlipidemia)   . Hypertension   . Obesity   . Orthopnea   . OSA (obstructive sleep apnea)   . Sleep apnea   . SOB (shortness of breath)   . Thyroid disease     Past Surgical History:  Procedure Laterality Date  . CARDIOVERSION N/A 05/24/2020   Procedure: CARDIOVERSION;  Surgeon: Christell Constant, MD;  Location: [facility] ENDOS[facility];  Service: Cardiovascular;  Laterality: N/A;  . CORONARY ANGIOPLASTY WITH STENT PLACEMENT    . VASECTOMY       Social History   Socioeconomic History  . Marital status: Married    Spouse name: Not on file  . Number of children: Not on file  . Years of education: Not on file  . Highest education level: Not on file  Occupational History  . Not on file  Tobacco Use  . Smoking status: Former Smoker    Types: Cigarettes, Pipe  . Smokeless tobacco: Never Used  Vaping Use  . Vaping Use: Never used  Substance and Sexual Activity  . Alcohol use: Yes    Alcohol/week: 20.0 standard drinks    Types: 14 Glasses of wine, 6 Cans of beer per week  . Drug use: Never  . Sexual activity: Yes    Birth control/protection: Surgical    Comment: Vascetomy  Other Topics Concern  . Not on file  Social History Narrative  . Not on file   Social Determinants of Health   Financial Resource Strain: Not on file  Food Insecurity: Not on file  Transportation Needs: Not on file  Physical Activity: Not on file  Stress: Not on file  Social Connections: Not on file  Intimate Partner Violence: Not on file    Current Outpatient Medications on File Prior to Visit  Medication Sig Dispense Refill  . ACCU-CHEK  GUIDE test strip     . Accu-Chek Softclix Lancets lancets 1 each 3 (three) times daily.    Marland Kitchen acetaminophen (TYLENOL) 500 MG tablet Take 500 mg by mouth every 6 (six) hours as needed for moderate pain.    Marland Kitchen albuterol (VENTOLIN HFA) 108 (90 Base) MCG/ACT inhaler Inhale 2 puffs into the lungs every 6 (six) hours as needed for wheezing or shortness of breath.    . Dulaglutide (TRULICITY) 1.5 MG/0.5ML SOPN Inject 1.5 mg once weekly for 4 weeks then increase to 3 mg weekly 15 mL 3  . ELIQUIS 5 MG TABS tablet TAKE 1 TABLET BY MOUTH TWICE A DAY 180 tablet 0  . furosemide (LASIX) 20 MG tablet TAKE 1 TABLET BY MOUTH EVERY DAY IN THE MORNING 90 tablet 0  . levothyroxine (SYNTHROID) 50 MCG tablet TAKE 1 TABLET BY MOUTH EVERY DAY 90 tablet 0  . metFORMIN (GLUCOPHAGE) 500 MG tablet TAKE 2 TABLETS (1,000 MG TOTAL) BY  MOUTH 2 (TWO) TIMES DAILY AS NEEDED. **MUST ESTABLISH LOCAL PCP** 120 tablet 1  . metoprolol (TOPROL-XL) 200 MG 24 hr tablet TAKE 1 TABLET BY MOUTH NIGHTLY 90 tablet 0  . Multiple Vitamin (MULTIVITAMIN) capsule Take 1 capsule by mouth daily.    . rosuvastatin (CRESTOR) 40 MG tablet TAKE 1 TABLET BY MOUTH EVERY DAY 90 tablet 0  . sacubitril-valsartan (ENTRESTO) 24-26 MG Take 1 tablet by mouth 2 (two) times daily.    Marland Kitchen tiotropium (SPIRIVA) 18 MCG inhalation capsule Place 18 mcg into inhaler and inhale daily.     No current facility-administered medications on file prior to visit.    Allergies  Allergen Reactions  . Lipitor [Atorvastatin] Other (See Comments)    Myalgias, flu like symptoms    Family History  Problem Relation Age of Onset  . Depression Mother   . Heart disease Mother   . Hyperlipidemia Mother   . Stroke Mother   . Cancer Father   . Diabetes Father   . Early death Father   . Arthritis Sister   . Birth defects Sister   . Heart disease Sister   . Early death Brother   . Birth defects Brother   . Heart disease Brother   . Other Neg Hx        low testosterone    BP 110/62   Pulse (!) 55   Ht 5\' 11"  (1.803 m)   Wt 282 lb (127.9 kg)   SpO2 96%   BMI 39.33 kg/m     Review of Systems denies depression, numbness, and headache     Objective:   Physical Exam    Lab Results  Component Value Date   WBC 6.7 05/04/2020   HGB 15.3 05/24/2020   HCT 45.0 05/24/2020   MCV 96 05/04/2020   PLT 229 05/04/2020   Lab Results  Component Value Date   TESTOSTERONE 163.48 (L) 04/11/2020   Lab Results  Component Value Date   TSH 6.35 (H) 04/11/2020   Lab Results  Component Value Date   PSA 0.55 04/11/2020   I have reviewed outside records, and summarized: Pt was noted to have low testosterone, and referred here.  CAD and AF were also addressed  Lab Results  Component Value Date   TSH 5.69 (H) 07/03/2020      Assessment & Plan:  Low testosterone,  uncertain etiology and prognosis Hypothyroidism, uncontrolled.  I have sent a prescription to your pharmacy, to increase synthyroid  Patient Instructions  Blood tests are requested  for you today.  We'll let you know about the results.  Testosterone treatment has risks, including hair loss, prostate cancer, benign prostate enlargement, blood clots, liver problems, lower hdl ("good cholesterol"), polycythemia (opposite of anemia), worsening of sleep apnea, and behavior changes. Based on the results, I hope to be able to prescribe for you a pill to increase the testosterone.   Please come back for a follow-up appointment in 6 months.

## 2020-07-03 NOTE — Patient Instructions (Signed)
Blood tests are requested for you today.  We'll let you know about the results.  Testosterone treatment has risks, including hair loss, prostate cancer, benign prostate enlargement, blood clots, liver problems, lower hdl ("good cholesterol"), polycythemia (opposite of anemia), worsening of sleep apnea, and behavior changes. Based on the results, I hope to be able to prescribe for you a pill to increase the testosterone.   Please come back for a follow-up appointment in 6 months.

## 2020-07-04 LAB — TESTOSTERONE,FREE AND TOTAL
Testosterone, Free: 6.2 pg/mL — ABNORMAL LOW (ref 6.6–18.1)
Testosterone: 163 ng/dL — ABNORMAL LOW (ref 264–916)

## 2020-07-05 ENCOUNTER — Encounter: Payer: Self-pay | Admitting: Endocrinology

## 2020-07-07 ENCOUNTER — Other Ambulatory Visit: Payer: Self-pay | Admitting: Physician Assistant

## 2020-07-09 ENCOUNTER — Other Ambulatory Visit: Payer: Medicare PPO | Admitting: *Deleted

## 2020-07-09 ENCOUNTER — Other Ambulatory Visit: Payer: Self-pay

## 2020-07-09 DIAGNOSIS — I4811 Longstanding persistent atrial fibrillation: Secondary | ICD-10-CM

## 2020-07-09 DIAGNOSIS — Z01812 Encounter for preprocedural laboratory examination: Secondary | ICD-10-CM

## 2020-07-09 LAB — BASIC METABOLIC PANEL
BUN/Creatinine Ratio: 15 (ref 10–24)
BUN: 13 mg/dL (ref 8–27)
CO2: 21 mmol/L (ref 20–29)
Calcium: 9.3 mg/dL (ref 8.6–10.2)
Chloride: 103 mmol/L (ref 96–106)
Creatinine, Ser: 0.89 mg/dL (ref 0.76–1.27)
GFR calc Af Amer: 99 mL/min/{1.73_m2} (ref >59)
GFR calc non Af Amer: 85 mL/min/{1.73_m2} (ref >59)
Glucose: 190 mg/dL — ABNORMAL HIGH (ref 65–99)
Potassium: 4.6 mmol/L (ref 3.5–5.2)
Sodium: 140 mmol/L (ref 134–144)

## 2020-07-09 LAB — CBC
Hematocrit: 42.9 % (ref 37.5–51.0)
Hemoglobin: 14.6 g/dL (ref 13.0–17.7)
MCH: 31.9 pg (ref 26.6–33.0)
MCHC: 34 g/dL (ref 31.5–35.7)
MCV: 94 fL (ref 79–97)
Platelets: 203 10*3/uL (ref 150–450)
RBC: 4.57 x10E6/uL (ref 4.14–5.80)
RDW: 12.2 % (ref 11.6–15.4)
WBC: 6.3 10*3/uL (ref 3.4–10.8)

## 2020-07-12 LAB — ESTRADIOL, FREE
Estradiol, Free: 0.53 pg/mL — ABNORMAL HIGH
Estradiol: 23 pg/mL (ref <=29)

## 2020-07-12 LAB — PROLACTIN: Prolactin: 5.8 ng/mL (ref 2.0–18.0)

## 2020-07-13 ENCOUNTER — Other Ambulatory Visit: Payer: Self-pay | Admitting: Endocrinology

## 2020-07-13 DIAGNOSIS — R7989 Other specified abnormal findings of blood chemistry: Secondary | ICD-10-CM

## 2020-07-13 MED ORDER — TAMOXIFEN CITRATE 10 MG PO TABS: 10.0000 mg | ORAL_TABLET | Freq: Every day | ORAL | 3 refills | Status: DC

## 2020-07-17 ENCOUNTER — Encounter (HOSPITAL_COMMUNITY): Payer: Self-pay

## 2020-07-17 ENCOUNTER — Other Ambulatory Visit: Payer: Self-pay

## 2020-07-17 ENCOUNTER — Other Ambulatory Visit: Payer: Self-pay | Admitting: Family Medicine

## 2020-07-17 ENCOUNTER — Ambulatory Visit (HOSPITAL_BASED_OUTPATIENT_CLINIC_OR_DEPARTMENT_OTHER): Payer: Medicare PPO | Attending: Internal Medicine | Admitting: Cardiology

## 2020-07-17 ENCOUNTER — Telehealth (HOSPITAL_COMMUNITY): Payer: Self-pay | Admitting: Emergency Medicine

## 2020-07-17 DIAGNOSIS — G4734 Idiopathic sleep related nonobstructive alveolar hypoventilation: Secondary | ICD-10-CM

## 2020-07-17 DIAGNOSIS — I4819 Other persistent atrial fibrillation: Secondary | ICD-10-CM | POA: Diagnosis not present

## 2020-07-17 DIAGNOSIS — R400 Somnolence: Secondary | ICD-10-CM

## 2020-07-17 DIAGNOSIS — G4736 Sleep related hypoventilation in conditions classified elsewhere: Secondary | ICD-10-CM | POA: Insufficient documentation

## 2020-07-17 DIAGNOSIS — G4733 Obstructive sleep apnea (adult) (pediatric): Secondary | ICD-10-CM

## 2020-07-17 NOTE — Telephone Encounter (Signed)
Attempted to call patient regarding upcoming cardiac CT appointment. °Left message on voicemail with name and callback number °Eva Vallee RN Navigator Cardiac Imaging °[facility] °[phone] Office °[phone] Cell ° °

## 2020-07-18 NOTE — Procedures (Signed)
   Patient Name: Douglas Cowan, Douglas Cowan Date: 07/17/2020 Gender: Male D.O.B: [birth year] Age (years): 74 Referring Provider: Riley Lam Height (inches): 71 Interpreting Physician: Armanda Magic MD, ABSM Weight (lbs): 275 RPSGT: Armen Pickup BMI: 38 MRN: [id number] Neck Size: 20.50  CLINICAL INFORMATION Sleep Study Type: NPSG  Indication for sleep study: OSA  Epworth Sleepiness Score: 6  SLEEP STUDY TECHNIQUE As per the AASM Manual for the Scoring of Sleep and Associated Events v2.3 (April 2016) with a hypopnea requiring 4% desaturations.  The channels recorded and monitored were frontal, central and occipital EEG, electrooculogram (EOG), submentalis EMG (chin), nasal and oral airflow, thoracic and abdominal wall motion, anterior tibialis EMG, snore microphone, electrocardiogram, and pulse oximetry.  MEDICATIONS Medications self-administered by patient taken the night of the study : TYLENOL PM  SLEEP ARCHITECTURE The study was initiated at 10:01:16 PM and ended at 3:53:45 AM.  Sleep onset time was 73.2 minutes and the sleep efficiency was 44.8%. The total sleep time was 158 minutes.  Stage REM latency was 249.0 minutes.  The patient spent 15.5% of the night in stage N1 sleep, 81.3% in stage N2 sleep, 0.0% in stage N3 and 3.2% in REM.  Alpha intrusion was absent.  Supine sleep was 49.46%.  RESPIRATORY PARAMETERS The overall apnea/hypopnea index (AHI) was 33.4 per hour. There were 1 total apneas, including 1 obstructive, 0 central and 0 mixed apneas. There were 87 hypopneas and 36 RERAs.  The AHI during Stage REM sleep was 12.0 per hour.  AHI while supine was 44.5 per hour.  The mean oxygen saturation was 89.1%. The minimum SpO2 during sleep was 83.0%.  soft snoring was noted during this study.  CARDIAC DATA The 2 lead EKG demonstrated sinus rhythm. The mean heart rate was 64.8 beats per minute. Other EKG findings include: PACs and PVCs.  LEG MOVEMENT  DATA The total PLMS were 0 with a resulting PLMS index of 0.0. Associated arousal with leg movement index was 1.5 .  IMPRESSIONS - Moderate obstructive sleep apnea occurred during this study (AHI = 33.4/h). - Mild oxygen desaturation was noted during this study (Min O2 = 83.0%). - The patient snored with soft snoring volume. - PACs and PVCs were noted during this study. - Clinically significant periodic limb movements did not occur during sleep. No significant associated arousals.  DIAGNOSIS - Obstructive Sleep Apnea (G47.33) - Nocturnal Hypoxemia (G47.36)  RECOMMENDATIONS - Therapeutic CPAP titration to determine optimal pressure required to alleviate sleep disordered breathing. - Positional therapy avoiding supine position during sleep. - Avoid alcohol, sedatives and other CNS depressants that may worsen sleep apnea and disrupt normal sleep architecture. - Sleep hygiene should be reviewed to assess factors that may improve sleep quality. - Weight management and regular exercise should be initiated or continued if appropriate.  [Electronically signed] 07/18/2020 11:02 AM  Armanda Magic MD, ABSM Diplomate, American Board of Sleep Medicine

## 2020-07-19 ENCOUNTER — Ambulatory Visit (HOSPITAL_COMMUNITY)
Admission: RE | Admit: 2020-07-19 | Discharge: 2020-07-19 | Disposition: A | Discharge status: Home / Self Care | Payer: Medicare PPO | Source: Ambulatory Visit | Attending: Cardiology | Admitting: Cardiology

## 2020-07-19 ENCOUNTER — Other Ambulatory Visit: Payer: Self-pay

## 2020-07-19 DIAGNOSIS — I4811 Longstanding persistent atrial fibrillation: Secondary | ICD-10-CM

## 2020-07-19 MED ORDER — IOHEXOL 350 MG/ML SOLN
100.0000 mL | Freq: Once | INTRAVENOUS | Status: AC | PRN
  Administered 2020-07-19: 100 mL via INTRAVENOUS

## 2020-07-22 ENCOUNTER — Other Ambulatory Visit: Payer: Self-pay | Admitting: Physician Assistant

## 2020-07-24 ENCOUNTER — Other Ambulatory Visit (HOSPITAL_COMMUNITY)
Admission: RE | Admit: 2020-07-24 | Discharge: 2020-07-24 | Disposition: A | Discharge status: Home / Self Care | Payer: Medicare PPO | Source: Ambulatory Visit | Attending: Cardiology | Admitting: Cardiology

## 2020-07-24 ENCOUNTER — Telehealth: Payer: Self-pay | Admitting: *Deleted

## 2020-07-24 DIAGNOSIS — Z20822 Contact with and (suspected) exposure to covid-19: Secondary | ICD-10-CM | POA: Insufficient documentation

## 2020-07-24 DIAGNOSIS — Z01812 Encounter for preprocedural laboratory examination: Secondary | ICD-10-CM | POA: Insufficient documentation

## 2020-07-24 DIAGNOSIS — G4733 Obstructive sleep apnea (adult) (pediatric): Secondary | ICD-10-CM

## 2020-07-24 LAB — SARS CORONAVIRUS 2 (TAT 6-24 HRS): SARS Coronavirus 2: NEGATIVE

## 2020-07-24 NOTE — Telephone Encounter (Signed)
-----   Message from Quintella Reichert, MD sent at 07/18/2020 11:03 AM EST ----- Please let patient know that they have sleep apnea and recommend CPAP titration. Please set up titration in the sleep lab.

## 2020-07-24 NOTE — Telephone Encounter (Signed)
Informed patient of sleep study results and patient understanding was verbalized. Patient understands his sleep study showed they have sleep apnea and recommend CPAP titration. Please set up titration in the sleep lab.   Left detailed message on voicemail and informed patient to call back with questions.  cpap titration sent to sleep pool

## 2020-07-25 ENCOUNTER — Encounter (HOSPITAL_COMMUNITY): Payer: Self-pay | Admitting: Cardiology

## 2020-07-25 NOTE — Pre-Procedure Instructions (Signed)
Instructed patient on the following items: Arrival time 0530 Nothing to eat or drink after midnight No meds AM of procedure Responsible person to drive you home and stay with you for 24 hrs  Have you missed any doses of anti-coagulant Eliquis- hasn't missed any doses    

## 2020-07-25 NOTE — Anesthesia Preprocedure Evaluation (Addendum)
Anesthesia Evaluation  Patient identified by MRN, date of birth, ID band Patient awake    Reviewed: Allergy & Precautions, NPO status , Patient's Chart, lab work & pertinent test results, reviewed documented beta blocker date and time   History of Anesthesia Complications Negative for: history of anesthetic complications  Airway Mallampati: II  TM Distance: >3 FB Neck ROM: Full    Dental no notable dental hx. (+) Dental Advisory Given   Pulmonary shortness of breath, sleep apnea and Continuous Positive Airway Pressure Ventilation , COPD,  COPD inhaler, former smoker,    Pulmonary exam normal        Cardiovascular hypertension, Pt. on home beta blockers and Pt. on medications + CAD and +CHF  Normal cardiovascular exam+ dysrhythmias + Valvular Problems/Murmurs MR  Rhythm:Regular Rate:Normal  Echo 05/04/2020 1. Left ventricular ejection fraction, by estimation, is 50% with beat to beat variability. The left ventricle has low normal function. Left ventricular endocardial border not optimally defined to evaluate regional wall motion despite the use of Definity. Inferior wall appears akinetic in short axis, inferolateral wall likely severely hypokinetic. There is mild left ventricular hypertrophy. Left ventricular diastolic parameters are indeterminate.  2. Right ventricular systolic function is mildly reduced. The right ventricular size is moderately enlarged. There is borderline mild pulmonary artery systolic pressure. The estimated right ventricular systolic pressure is 35.3 mmHg.  3. Left atrial size was mild to moderately dilated.  4. Mitral valve regurgitation likely eccentric and therefore may be underestimated, with evidence of splay artifact. MR is likely mild-moderate to moderate. The mitral valve is degenerative. Mild to moderate mitral valve regurgitation. No evidence of mitral stenosis. Moderate mitral annular calcification.   5. The aortic valve is grossly normal. There is mild calcification of the aortic valve. Aortic valve regurgitation is not visualized.  6. The inferior vena cava is normal in size with greater than 50% respiratory variability, suggesting right atrial pressure of 3 mmHg.    Neuro/Psych PSYCHIATRIC DISORDERS Anxiety Depression negative neurological ROS     GI/Hepatic negative GI ROS, Neg liver ROS,   Endo/Other  diabetesHypothyroidism Morbid obesity  Renal/GU negative Renal ROS     Musculoskeletal  (+) Arthritis ,   Abdominal (+) + obese,   Peds  Hematology negative hematology ROS (+)   Anesthesia Other Findings   Reproductive/Obstetrics                            Anesthesia Physical  Anesthesia Plan  ASA: III  Anesthesia Plan: General   Post-op Pain Management:    Induction: Intravenous  PONV Risk Score and Plan: 2 and Treatment may vary due to age or medical condition, Ondansetron and Dexamethasone  Airway Management Planned: Oral ETT and LMA  Additional Equipment:   Intra-op Plan:   Post-operative Plan: Extubation in OR  Informed Consent: I have reviewed the patients History and Physical, chart, labs and discussed the procedure including the risks, benefits and alternatives for the proposed anesthesia with the patient or authorized representative who has indicated his/her understanding and acceptance.     Dental advisory given  Plan Discussed with: Anesthesiologist and CRNA  Anesthesia Plan Comments:        Anesthesia Quick Evaluation

## 2020-07-26 ENCOUNTER — Ambulatory Visit (HOSPITAL_COMMUNITY): Payer: Medicare PPO | Admitting: Certified Registered"

## 2020-07-26 ENCOUNTER — Telehealth: Payer: Self-pay | Admitting: *Deleted

## 2020-07-26 ENCOUNTER — Ambulatory Visit (HOSPITAL_COMMUNITY)
Admission: RE | Admit: 2020-07-26 | Discharge: 2020-07-26 | Disposition: A | Discharge status: Home / Self Care | Payer: Medicare PPO | Attending: Cardiology | Admitting: Cardiology

## 2020-07-26 ENCOUNTER — Encounter (HOSPITAL_COMMUNITY)
Admission: RE | Disposition: A | Discharge status: Home / Self Care | Payer: Medicare PPO | Source: Home / Self Care | Attending: Cardiology

## 2020-07-26 DIAGNOSIS — I4892 Unspecified atrial flutter: Secondary | ICD-10-CM | POA: Insufficient documentation

## 2020-07-26 DIAGNOSIS — I4811 Longstanding persistent atrial fibrillation: Secondary | ICD-10-CM | POA: Insufficient documentation

## 2020-07-26 DIAGNOSIS — Z955 Presence of coronary angioplasty implant and graft: Secondary | ICD-10-CM | POA: Insufficient documentation

## 2020-07-26 DIAGNOSIS — Z87891 Personal history of nicotine dependence: Secondary | ICD-10-CM | POA: Diagnosis not present

## 2020-07-26 DIAGNOSIS — I4819 Other persistent atrial fibrillation: Secondary | ICD-10-CM | POA: Diagnosis not present

## 2020-07-26 DIAGNOSIS — I2510 Atherosclerotic heart disease of native coronary artery without angina pectoris: Secondary | ICD-10-CM | POA: Insufficient documentation

## 2020-07-26 DIAGNOSIS — Z6838 Body mass index (BMI) 38.0-38.9, adult: Secondary | ICD-10-CM | POA: Diagnosis not present

## 2020-07-26 DIAGNOSIS — E119 Type 2 diabetes mellitus without complications: Secondary | ICD-10-CM | POA: Insufficient documentation

## 2020-07-26 HISTORY — PX: ATRIAL FIBRILLATION ABLATION: EP1191

## 2020-07-26 LAB — POCT ACTIVATED CLOTTING TIME
Activated Clotting Time: 315 seconds
Activated Clotting Time: 321 seconds

## 2020-07-26 LAB — GLUCOSE, CAPILLARY
Glucose-Capillary: 147 mg/dL — ABNORMAL HIGH (ref 70–99)
Glucose-Capillary: 154 mg/dL — ABNORMAL HIGH (ref 70–99)

## 2020-07-26 SURGERY — ATRIAL FIBRILLATION ABLATION
Anesthesia: General

## 2020-07-26 MED ORDER — ONDANSETRON HCL 4 MG/2ML IJ SOLN
INTRAMUSCULAR | Status: DC | PRN
  Administered 2020-07-26: 4 mg via INTRAVENOUS

## 2020-07-26 MED ORDER — SUGAMMADEX SODIUM 200 MG/2ML IV SOLN
INTRAVENOUS | Status: DC | PRN
  Administered 2020-07-26: 400 mg via INTRAVENOUS

## 2020-07-26 MED ORDER — FENTANYL CITRATE (PF) 100 MCG/2ML IJ SOLN
INTRAMUSCULAR | Status: DC | PRN
  Administered 2020-07-26: 100 ug via INTRAVENOUS

## 2020-07-26 MED ORDER — SODIUM CHLORIDE 0.9 % IV SOLN: 250.0000 mL | INTRAVENOUS | Status: DC | PRN

## 2020-07-26 MED ORDER — HEPARIN (PORCINE) IN NACL 1000-0.9 UT/500ML-% IV SOLN
INTRAVENOUS | Status: AC
  Filled 2020-07-26: qty 500

## 2020-07-26 MED ORDER — PROTAMINE SULFATE 10 MG/ML IV SOLN
INTRAVENOUS | Status: DC | PRN
  Administered 2020-07-26: 40 mg via INTRAVENOUS

## 2020-07-26 MED ORDER — HEPARIN SODIUM (PORCINE) 1000 UNIT/ML IJ SOLN
INTRAMUSCULAR | Status: DC | PRN
  Administered 2020-07-26: 16000 [IU] via INTRAVENOUS
  Administered 2020-07-26: 2000 [IU] via INTRAVENOUS

## 2020-07-26 MED ORDER — DOBUTAMINE IN D5W 4-5 MG/ML-% IV SOLN
INTRAVENOUS | Status: DC | PRN
  Administered 2020-07-26: 20 ug/kg/min via INTRAVENOUS

## 2020-07-26 MED ORDER — DOBUTAMINE IN D5W 4-5 MG/ML-% IV SOLN
INTRAVENOUS | Status: AC
  Filled 2020-07-26: qty 250

## 2020-07-26 MED ORDER — ROCURONIUM BROMIDE 10 MG/ML (PF) SYRINGE
PREFILLED_SYRINGE | INTRAVENOUS | Status: DC | PRN
  Administered 2020-07-26: 100 mg via INTRAVENOUS

## 2020-07-26 MED ORDER — PROPOFOL 10 MG/ML IV BOLUS
INTRAVENOUS | Status: DC | PRN
  Administered 2020-07-26: 180 mg via INTRAVENOUS

## 2020-07-26 MED ORDER — SODIUM CHLORIDE 0.9 % IV SOLN: INTRAVENOUS | Status: DC

## 2020-07-26 MED ORDER — HEPARIN SODIUM (PORCINE) 1000 UNIT/ML IJ SOLN
INTRAMUSCULAR | Status: DC | PRN
  Administered 2020-07-26: 1000 [IU] via INTRAVENOUS

## 2020-07-26 MED ORDER — ONDANSETRON HCL 4 MG/2ML IJ SOLN: 4.0000 mg | Freq: Four times a day (QID) | INTRAMUSCULAR | Status: DC | PRN

## 2020-07-26 MED ORDER — HEPARIN SODIUM (PORCINE) 1000 UNIT/ML IJ SOLN
INTRAMUSCULAR | Status: AC
  Filled 2020-07-26: qty 1

## 2020-07-26 MED ORDER — ACETAMINOPHEN 325 MG PO TABS
650.0000 mg | ORAL_TABLET | ORAL | Status: DC | PRN
  Filled 2020-07-26: qty 2

## 2020-07-26 MED ORDER — LIDOCAINE 2% (20 MG/ML) 5 ML SYRINGE
INTRAMUSCULAR | Status: DC | PRN
  Administered 2020-07-26: 100 mg via INTRAVENOUS

## 2020-07-26 MED ORDER — PHENYLEPHRINE HCL-NACL 10-0.9 MG/250ML-% IV SOLN
INTRAVENOUS | Status: DC | PRN
  Administered 2020-07-26: 75 ug/min via INTRAVENOUS

## 2020-07-26 MED ORDER — ACETAMINOPHEN 500 MG PO TABS
1000.0000 mg | ORAL_TABLET | Freq: Once | ORAL | Status: AC
  Administered 2020-07-26: 1000 mg via ORAL
  Filled 2020-07-26: qty 2

## 2020-07-26 MED ORDER — SODIUM CHLORIDE 0.9% FLUSH: 3.0000 mL | INTRAVENOUS | Status: DC | PRN

## 2020-07-26 MED ORDER — APIXABAN 5 MG PO TABS
5.0000 mg | ORAL_TABLET | ORAL | Status: AC
  Administered 2020-07-26: 5 mg via ORAL
  Filled 2020-07-26: qty 1

## 2020-07-26 MED ORDER — HEPARIN (PORCINE) IN NACL 1000-0.9 UT/500ML-% IV SOLN
INTRAVENOUS | Status: DC | PRN
  Administered 2020-07-26 (×5): 500 mL

## 2020-07-26 SURGICAL SUPPLY — 22 items
BAG SNAP BAND KOVER 36X36 (MISCELLANEOUS) ×2 IMPLANT
BLANKET WARM UNDERBOD FULL ACC (MISCELLANEOUS) ×2 IMPLANT
CATH MAPPNG PENTARAY F 2-6-2MM (CATHETERS) ×1 IMPLANT
CATH S CIRCA THERM PROBE 10F (CATHETERS) ×2 IMPLANT
CATH SMTCH THERMOCOOL SF DF (CATHETERS) ×2 IMPLANT
CATH SOUNDSTAR ECO 8FR (CATHETERS) ×2 IMPLANT
CATH WEB BI DIR CSDF CRV REPRO (CATHETERS) ×2 IMPLANT
CLOSURE PERCLOSE PROSTYLE (VASCULAR PRODUCTS) ×8 IMPLANT
COVER SWIFTLINK CONNECTOR (BAG) ×2 IMPLANT
KIT VERSACROSS STEERABLE D1 (CATHETERS) ×2 IMPLANT
MAT PREVALON FULL STRYKER (MISCELLANEOUS) ×2 IMPLANT
PACK EP LATEX FREE (CUSTOM PROCEDURE TRAY) ×2
PACK EP LF (CUSTOM PROCEDURE TRAY) ×1 IMPLANT
PAD PRO RADIOLUCENT 2001M-C (PAD) ×2 IMPLANT
PATCH CARTO3 (PAD) ×2 IMPLANT
PENTARAY F 2-6-2MM (CATHETERS) ×2
SHEATH CARTO VIZIGO SM CVD (SHEATH) ×2 IMPLANT
SHEATH PINNACLE 7F 10CM (SHEATH) ×2 IMPLANT
SHEATH PINNACLE 8F 10CM (SHEATH) ×4 IMPLANT
SHEATH PINNACLE 9F 10CM (SHEATH) ×2 IMPLANT
SHEATH PROBE COVER 6X72 (BAG) ×2 IMPLANT
TUBING SMART ABLATE COOLFLOW (TUBING) ×2 IMPLANT

## 2020-07-26 NOTE — Progress Notes (Signed)
Pt ambulated without difficulty or bleeding.   Discharged home with wife who will drive and stay with pt x 24 hrs 

## 2020-07-26 NOTE — Telephone Encounter (Signed)
CPAP titration PA request submitted to [employer] via web portal. 

## 2020-07-26 NOTE — Anesthesia Postprocedure Evaluation (Signed)
Anesthesia Post Note  Patient: Douglas Cowan  Procedure(s) Performed: ATRIAL FIBRILLATION ABLATION (N/A )     Patient location during evaluation: PACU Anesthesia Type: General Level of consciousness: sedated Pain management: pain level controlled Vital Signs Assessment: post-procedure vital signs reviewed and stable Respiratory status: spontaneous breathing and respiratory function stable Cardiovascular status: stable Postop Assessment: no apparent nausea or vomiting Anesthetic complications: no   No complications documented.  Last Vitals:  Vitals:   07/26/20 1030 07/26/20 1035  BP: 125/61 (!) 116/57  Pulse: 67 66  Resp: 18 16  Temp:    SpO2: 92% 92%    Last Pain:  Vitals:   07/26/20 1029  TempSrc: Temporal  PainSc: 0-No pain                 Mckenlee Mangham DANIEL

## 2020-07-26 NOTE — Anesthesia Procedure Notes (Signed)
Procedure Name: Intubation Performed by: Cleda Daub, CRNA Pre-anesthesia Checklist: Patient identified, Emergency Drugs available, Suction available and Patient being monitored Patient Re-evaluated:Patient Re-evaluated prior to induction Oxygen Delivery Method: Circle system utilized Preoxygenation: Pre-oxygenation with 100% oxygen Induction Type: IV induction Ventilation: Mask ventilation without difficulty Laryngoscope Size: Glidescope and 4 (grade 3 with MAC 3.) Grade View: Grade I Tube type: Oral Number of attempts: 1 Airway Equipment and Method: Stylet and Oral airway Placement Confirmation: ETT inserted through vocal cords under direct vision,  positive ETCO2 and breath sounds checked- equal and bilateral Secured at: 23 cm Tube secured with: Tape Dental Injury: Teeth and Oropharynx as per pre-operative assessment

## 2020-07-26 NOTE — Discharge Instructions (Signed)

## 2020-07-26 NOTE — Transfer of Care (Signed)
Immediate Anesthesia Transfer of Care Note  Patient: Douglas Cowan  Procedure(s) Performed: ATRIAL FIBRILLATION ABLATION (N/A )  Patient Location: PACU  Anesthesia Type:General  Level of Consciousness: awake, alert , oriented and patient cooperative  Airway & Oxygen Therapy: Patient Spontanous Breathing and Patient connected to face mask oxygen  Post-op Assessment: Report given to RN and Post -op Vital signs reviewed and stable  Post vital signs: Reviewed and stable  Last Vitals:  Vitals Value Taken Time  BP 127/85 07/26/20 0954  Temp    Pulse 69 07/26/20 0956  Resp 12 07/26/20 0956  SpO2 96 % 07/26/20 0956  Vitals shown include unvalidated device data.  Last Pain:  Vitals:   07/26/20 0952  TempSrc: Temporal  PainSc: 0-No pain      Patients Stated Pain Goal: 3 (07/26/20 3212)  Complications: No complications documented.

## 2020-07-26 NOTE — Progress Notes (Signed)
Bilateral groins excoriated prior to admission to short stay. Red and raw.

## 2020-07-26 NOTE — H&P (Signed)
Electrophysiology Office Note   Date:  07/26/2020   ID:  Douglas Cowan, DOB 1946/01/01, MRN [id number]  PCP:  Waldon Merl, PA-C  Cardiologist:  Izora Ribas Primary Electrophysiologist:  Will Jorja Loa, MD    Chief Complaint: AF   History of Present Illness: Douglas Cowan is a 75 y.o. male who is being seen today for the evaluation of AF at the request of No ref. provider found. Presenting today for electrophysiology evaluation.  He has a history significant for coronary artery disease with stenting in 2011, atrial flutter status post ablation, longstanding persistent atrial fibrillation, diabetes, morbid obesity.  He has a history of longstanding persistent atrial fibrillation.  He was told that he was in atrial fibrillation after his atrial flutter ablation.  He is now status post cardioversion.  He has quite a bit less shortness of breath and quite a bit more energy.  He works as a Agricultural consultant at [employer], and says that he has an easier time transporting patients.  Today, denies symptoms of palpitations, chest pain, shortness of breath, orthopnea, PND, lower extremity edema, claudication, dizziness, presyncope, syncope, bleeding, or neurologic sequela. The patient is tolerating medications without difficulties. Plan AF ablation today.    Past Medical History:  Diagnosis Date  . Abdominal aortic aneurysm (AAA) (HCC)   . Anxiety   . Arthritis   . BBB (bundle branch block)   . CAD (coronary artery disease)   . CHF (congestive heart failure) (HCC)   . COPD (chronic obstructive pulmonary disease) (HCC)   . CPAP (continuous positive airway pressure) dependence   . Depression   . DM (diabetes mellitus) (HCC)   . DOE (dyspnea on exertion)   . HLD (hyperlipidemia)   . Hypertension   . Obesity   . Orthopnea   . OSA (obstructive sleep apnea)   . Sleep apnea   . SOB (shortness of breath)   . Thyroid disease    Past Surgical History:  Procedure Laterality Date  .  CARDIOVERSION N/A 05/24/2020   Procedure: CARDIOVERSION;  Surgeon: Christell Constant, MD;  Location: [facility];  Service: Cardiovascular;  Laterality: N/A;  . CORONARY ANGIOPLASTY WITH STENT PLACEMENT    . VASCULAR SURGERY    . VASECTOMY       Current Facility-Administered Medications  Medication Dose Route Frequency Provider Last Rate Last Admin  . 0.9 %  sodium chloride infusion   Intravenous Continuous Regan Lemming, MD 50 mL/hr at 07/26/20 2703 New Bag at 07/26/20 0627    Allergies:   Lipitor [atorvastatin]   Social History:  The patient  reports that he has quit smoking. His smoking use included cigarettes and pipe. He has never used smokeless tobacco. He reports current alcohol use of about 20.0 standard drinks of alcohol per week. He reports that he does not use drugs.   Family History:  The patient's family history includes Arthritis in his sister; Birth defects in his brother and sister; Cancer in his father; Depression in his mother; Diabetes in his father; Early death in his brother and father; Heart disease in his brother, mother, and sister; Hyperlipidemia in his mother; Stroke in his mother.   ROS:  Please see the history of present illness.   Otherwise, review of systems is positive for none.   All other systems are reviewed and negative.   PHYSICAL EXAM: VS:  BP 135/68   Pulse (!) 58   Temp 97.6 F (36.4 C) (Oral)   Resp 17   Ht  5\' 11"  (1.803 m)   Wt 124.7 kg   SpO2 95%   BMI 38.35 kg/m  , BMI Body mass index is 38.35 kg/m. GEN: Well nourished, well developed, in no acute distress  HEENT: normal  Neck: no JVD, carotid bruits, or masses Cardiac: RRR; no murmurs, rubs, or gallops,no edema  Respiratory:  clear to auscultation bilaterally, normal work of breathing GI: soft, nontender, nondistended, + BS MS: no deformity or atrophy  Skin: warm and dry Neuro:  Strength and sensation are intact Psych: euthymic mood, full affect    Recent  Labs: 03/15/2020: NT-Pro BNP 256 04/11/2020: ALT 49 04/16/2020: Magnesium 1.9 07/03/2020: TSH 5.69 07/09/2020: BUN 13; Creatinine, Ser 0.89; Hemoglobin 14.6; Platelets 203; Potassium 4.6; Sodium 140    Lipid Panel     Component Value Date/Time   CHOL 135 04/11/2020 0927   TRIG 178.0 (H) 04/11/2020 0927   HDL 41.50 04/11/2020 0927   CHOLHDL 3 04/11/2020 0927   VLDL 35.6 04/11/2020 0927   LDLCALC 58 04/11/2020 0927     Wt Readings from Last 3 Encounters:  07/26/20 124.7 kg  07/17/20 124.7 kg  07/03/20 127.9 kg      Other studies Reviewed: Additional studies/ records that were reviewed today include: TTE 05/04/20  Review of the above records today demonstrates:  1. Left ventricular ejection fraction, by estimation, is 50% with beat to  beat variability. The left ventricle has low normal function. Left  ventricular endocardial border not optimally defined to evaluate regional  wall motion despite the use of  Definity. Inferior wall appears akinetic in short axis, inferolateral wall  likely severely hypokinetic. There is mild left ventricular hypertrophy.  Left ventricular diastolic parameters are indeterminate.  2. Right ventricular systolic function is mildly reduced. The right  ventricular size is moderately enlarged. There is borderline mild  pulmonary artery systolic pressure. The estimated right ventricular  systolic pressure is 35.3 mmHg.  3. Left atrial size was mild to moderately dilated.  4. Mitral valve regurgitation likely eccentric and therefore may be  underestimated, with evidence of splay artifact. MR is likely  mild-moderate to moderate. The mitral valve is degenerative. Mild to  moderate mitral valve regurgitation. No evidence of  mitral stenosis. Moderate mitral annular calcification.  5. The aortic valve is grossly normal. There is mild calcification of the  aortic valve. Aortic valve regurgitation is not visualized.  6. The inferior vena cava is  normal in size with greater than 50%  respiratory variability, suggesting right atrial pressure of 3 mmHg.    ASSESSMENT AND PLAN:  1. longstanding persistent atrial fibrillation/atrial flutter: Douglas Cowan has presented today for surgery, with the diagnosis of atrial fibrillation.  The various methods of treatment have been discussed with the patient and family. After consideration of risks, benefits and other options for treatment, the patient has consented to  Procedure(s): Catheter ablation as a surgical intervention .  Risks include but not limited to complete heart block, stroke, esophageal damage, nerve damage, bleeding, vascular damage, tamponade, perforation, MI, and death. The patient's history has been reviewed, patient examined, no change in status, stable for surgery.  I have reviewed the patient's chart and labs.  Questions were answered to the patient's satisfaction.    Will Greggory Stallion, MD 07/26/2020 7:04 AM

## 2020-07-27 ENCOUNTER — Telehealth: Payer: Self-pay | Admitting: *Deleted

## 2020-07-27 ENCOUNTER — Encounter (HOSPITAL_COMMUNITY): Payer: Self-pay | Admitting: Cardiology

## 2020-07-27 NOTE — Telephone Encounter (Signed)
Staff message sent to [facility] ok to schedule sleep titration study. [facility] Auth # [id number]. Valid dates 08/13/20 to 09/12/20.

## 2020-08-02 NOTE — Telephone Encounter (Signed)
Patient is scheduled for CPAP Titration on 09/03/20. Patient understands his titration study will be done at [facility] sleep lab. Patient understands he will receive a letter in a week or so detailing appointment, date, time, and location. Patient understands to call if he does not receive the letter  in a timely manner. Patient declined his study, he will talk with the referring provider and thanked me for call.

## 2020-08-05 ENCOUNTER — Encounter (HOSPITAL_COMMUNITY): Payer: Self-pay

## 2020-08-06 ENCOUNTER — Encounter: Payer: Self-pay | Admitting: Physician Assistant

## 2020-08-07 ENCOUNTER — Encounter (HOSPITAL_COMMUNITY): Payer: Self-pay | Admitting: Emergency Medicine

## 2020-08-07 ENCOUNTER — Emergency Department (HOSPITAL_COMMUNITY): Payer: Medicare PPO

## 2020-08-07 ENCOUNTER — Other Ambulatory Visit: Payer: Self-pay

## 2020-08-07 ENCOUNTER — Ambulatory Visit (HOSPITAL_COMMUNITY): Admission: EM | Admit: 2020-08-07 | Discharge: 2020-08-07 | Payer: Medicare PPO

## 2020-08-07 ENCOUNTER — Emergency Department (HOSPITAL_COMMUNITY)
Admission: EM | Admit: 2020-08-07 | Discharge: 2020-08-07 | Disposition: A | Discharge status: Home / Self Care | Payer: Medicare PPO | Attending: Emergency Medicine | Admitting: Emergency Medicine

## 2020-08-07 DIAGNOSIS — Z79899 Other long term (current) drug therapy: Secondary | ICD-10-CM | POA: Diagnosis not present

## 2020-08-07 DIAGNOSIS — E119 Type 2 diabetes mellitus without complications: Secondary | ICD-10-CM | POA: Diagnosis not present

## 2020-08-07 DIAGNOSIS — D6859 Other primary thrombophilia: Secondary | ICD-10-CM

## 2020-08-07 DIAGNOSIS — I5032 Chronic diastolic (congestive) heart failure: Secondary | ICD-10-CM | POA: Diagnosis not present

## 2020-08-07 DIAGNOSIS — Z7901 Long term (current) use of anticoagulants: Secondary | ICD-10-CM | POA: Diagnosis not present

## 2020-08-07 DIAGNOSIS — E039 Hypothyroidism, unspecified: Secondary | ICD-10-CM | POA: Insufficient documentation

## 2020-08-07 DIAGNOSIS — Z7984 Long term (current) use of oral hypoglycemic drugs: Secondary | ICD-10-CM | POA: Insufficient documentation

## 2020-08-07 DIAGNOSIS — J069 Acute upper respiratory infection, unspecified: Secondary | ICD-10-CM | POA: Insufficient documentation

## 2020-08-07 DIAGNOSIS — R0602 Shortness of breath: Secondary | ICD-10-CM | POA: Diagnosis present

## 2020-08-07 DIAGNOSIS — Z7401 Bed confinement status: Secondary | ICD-10-CM

## 2020-08-07 DIAGNOSIS — J449 Chronic obstructive pulmonary disease, unspecified: Secondary | ICD-10-CM | POA: Diagnosis not present

## 2020-08-07 DIAGNOSIS — I2510 Atherosclerotic heart disease of native coronary artery without angina pectoris: Secondary | ICD-10-CM | POA: Diagnosis not present

## 2020-08-07 DIAGNOSIS — Z86711 Personal history of pulmonary embolism: Secondary | ICD-10-CM | POA: Diagnosis not present

## 2020-08-07 DIAGNOSIS — Z87891 Personal history of nicotine dependence: Secondary | ICD-10-CM | POA: Diagnosis not present

## 2020-08-07 DIAGNOSIS — I110 Hypertensive heart disease with heart failure: Secondary | ICD-10-CM | POA: Insufficient documentation

## 2020-08-07 DIAGNOSIS — Z955 Presence of coronary angioplasty implant and graft: Secondary | ICD-10-CM | POA: Diagnosis not present

## 2020-08-07 LAB — COMPREHENSIVE METABOLIC PANEL
ALT: 39 U/L (ref 0–44)
AST: 40 U/L (ref 15–41)
Albumin: 3.7 g/dL (ref 3.5–5.0)
Alkaline Phosphatase: 42 U/L (ref 38–126)
Anion gap: 8 (ref 5–15)
BUN: 9 mg/dL (ref 8–23)
CO2: 26 mmol/L (ref 22–32)
Calcium: 9.2 mg/dL (ref 8.9–10.3)
Chloride: 104 mmol/L (ref 98–111)
Creatinine, Ser: 1.01 mg/dL (ref 0.61–1.24)
GFR, Estimated: >60 mL/min (ref >60)
Glucose, Bld: 218 mg/dL — ABNORMAL HIGH (ref 70–99)
Potassium: 4 mmol/L (ref 3.5–5.1)
Sodium: 138 mmol/L (ref 135–145)
Total Bilirubin: 0.5 mg/dL (ref 0.3–1.2)
Total Protein: 6.6 g/dL (ref 6.5–8.1)

## 2020-08-07 LAB — CBC WITH DIFFERENTIAL/PLATELET
Abs Immature Granulocytes: 0.02 10*3/uL (ref 0.00–0.07)
Basophils Absolute: 0 10*3/uL (ref 0.0–0.1)
Basophils Relative: 0 %
Eosinophils Absolute: 0.3 10*3/uL (ref 0.0–0.5)
Eosinophils Relative: 4 %
HCT: 43.3 % (ref 39.0–52.0)
Hemoglobin: 14.2 g/dL (ref 13.0–17.0)
Immature Granulocytes: 0 %
Lymphocytes Relative: 25 %
Lymphs Abs: 1.8 10*3/uL (ref 0.7–4.0)
MCH: 31.8 pg (ref 26.0–34.0)
MCHC: 32.8 g/dL (ref 30.0–36.0)
MCV: 96.9 fL (ref 80.0–100.0)
Monocytes Absolute: 0.8 10*3/uL (ref 0.1–1.0)
Monocytes Relative: 11 %
Neutro Abs: 4.3 10*3/uL (ref 1.7–7.7)
Neutrophils Relative %: 60 %
Platelets: 231 10*3/uL (ref 150–400)
RBC: 4.47 MIL/uL (ref 4.22–5.81)
RDW: 12.7 % (ref 11.5–15.5)
WBC: 7.3 10*3/uL (ref 4.0–10.5)
nRBC: 0 % (ref 0.0–0.2)

## 2020-08-07 LAB — TROPONIN I (HIGH SENSITIVITY): Troponin I (High Sensitivity): 9 ng/L (ref <18)

## 2020-08-07 LAB — BRAIN NATRIURETIC PEPTIDE: B Natriuretic Peptide: 55.1 pg/mL (ref 0.0–100.0)

## 2020-08-07 MED ORDER — AZITHROMYCIN 250 MG PO TABS: 250.0000 mg | ORAL_TABLET | Freq: Every day | ORAL | 0 refills | Status: DC

## 2020-08-07 NOTE — Discharge Instructions (Addendum)
Recommend following up with primary doctor and cardiology.  Return to ER if you develop chest pain, difficulty breathing, passing out, lightheadedness or other new concerning symptom.  Recommend trial of antibiotic as discussed.

## 2020-08-07 NOTE — ED Triage Notes (Signed)
Patient stated s/p ablation d/t afib and been on bedrest since. Here today d/t upper respiratory congestion and shortness of breath

## 2020-08-07 NOTE — ED Triage Notes (Signed)
Pt presents with SOB, chest congestion and sore throat. States had Ablation on 3/10 and was put on bed rest for 7 days. Has hx of COPD, but states SOB is worse than normal.   Has tried OTC sinus medication with very little relief. States is using inhaler 3-4 xs per day.

## 2020-08-07 NOTE — ED Notes (Signed)
Patient is being discharged from the Urgent Care and sent to the Emergency Department via personal vehicle. Per provider Wendee Beavers, patient is in need of higher level of care due to possible PE. Patient is aware and verbalizes understanding of plan of care.   Vitals:   08/07/20 1141  BP: 111/65  Pulse: 65  Resp: 19  Temp: 97.9 F (36.6 C)  SpO2: 98%

## 2020-08-07 NOTE — ED Provider Notes (Signed)
[facility]    CSN: [id number] Arrival date & time: 08/07/20  1051      History   Chief Complaint Chief Complaint  Patient presents with  . Sore Throat  . Chest Congestion  . Shortness of Breath    HPI Douglas Cowan is a 75 y.o. male.   Patient presents with acute shortness of breath x 1 day; worse since this morning.  He states he is normally short of breath due to COPD but this is "much worse."  Patient has been on bed rest for the past week after having a cardiac ablation for atrial fibrillation.  He also reports chest congestion and productive cough.  He denies fever, chills, chest pain, focal weakness, or other symptoms.  Treatment attempted with OTC cold medication.  His medical history includes persistent atrial fibrillation, heart failure, CAD, abdominal aortic aneurysm, COPD, diabetes, morbid obesity.  The history is provided by the patient and medical records.    Past Medical History:  Diagnosis Date  . Abdominal aortic aneurysm (AAA) (HCC)   . Anxiety   . Arthritis   . BBB (bundle branch block)   . CAD (coronary artery disease)   . CHF (congestive heart failure) (HCC)   . COPD (chronic obstructive pulmonary disease) (HCC)   . CPAP (continuous positive airway pressure) dependence   . Depression   . DM (diabetes mellitus) (HCC)   . DOE (dyspnea on exertion)   . HLD (hyperlipidemia)   . Hypertension   . Obesity   . Orthopnea   . OSA (obstructive sleep apnea)   . Sleep apnea   . SOB (shortness of breath)   . Thyroid disease     Patient Active Problem List   Diagnosis Date Noted  . Low testosterone level in male 07/03/2020  . Daytime somnolence 04/16/2020  . Coronary artery disease involving native coronary artery of native heart without angina pectoris 03/15/2020  . HFrEF (heart failure with reduced ejection fraction) (HCC) 03/15/2020  . Persistent atrial fibrillation (HCC) 03/15/2020  . Morbid obesity (HCC) 03/15/2020  . Diabetes mellitus with  coincident hypertension (HCC) 03/15/2020  . Chronic diastolic congestive heart failure (HCC) 09/29/2019  . Body mass index (BMI)40.0-44.9, adult 09/29/2019  . Hyperlipidemia associated with type 2 diabetes mellitus (HCC) 08/03/2018  . Anticoagulant long-term use 08/03/2018  . Other male erectile dysfunction 06/14/2018  . Abdominal aortic aneurysm (AAA) (HCC) 03/18/2018  . Type 2 diabetes mellitus without complication, with long-term current use of insulin (HCC) 02/11/2018  . Post herpetic neuralgia 02/11/2018  . Venous stasis 12/16/2017  . Adjustment insomnia 12/16/2017  . Pulmonary nodules 04/02/2017  . Dyspnea 11/13/2016  . Chronic obstructive pulmonary disease (HCC) 11/13/2016  . OSA on CPAP 06/19/2016  . Acquired hypothyroidism 04/28/2016    Past Surgical History:  Procedure Laterality Date  . ATRIAL FIBRILLATION ABLATION N/A 07/26/2020   Procedure: ATRIAL FIBRILLATION ABLATION;  Surgeon: Regan Lemmingamnitz, Will Martin, MD;  [facility];  Service: Cardiovascular;  Laterality: N/A;  . CARDIOVERSION N/A 05/24/2020   Procedure: CARDIOVERSION;  Surgeon: Christell Constanthandrasekhar, Mahesh A, MD;  Location: [facility];  Service: Cardiovascular;  Laterality: N/A;  . CORONARY ANGIOPLASTY WITH STENT PLACEMENT    . VASCULAR SURGERY    . VASECTOMY         Home Medications    Prior to Admission medications   Medication Sig Start Date End Date Taking? Authorizing Provider  ACCU-CHEK GUIDE test strip  03/26/20   [provider]  Accu-Chek Softclix Lancets  lancets 1 each 3 (three) times daily. 04/02/20   [provider]  acetaminophen (TYLENOL) 500 MG tablet Take 500 mg by mouth every 6 (six) hours as needed for moderate pain.    [provider]  albuterol (VENTOLIN HFA) 108 (90 Base) MCG/ACT inhaler Inhale 2 puffs into the lungs every 6 (six) hours as needed for wheezing or shortness of breath. 04/02/20   [provider]  Dulaglutide (TRULICITY) 1.5 MG/0.5ML  SOPN Inject 1.5 mg once weekly for 4 weeks then increase to 3 mg weekly Patient not taking: Reported on 07/17/2020 04/18/20   Waldon Merl, PA-C  Dulaglutide (TRULICITY) 3 MG/0.5ML SOPN Inject 3 mg into the skin once a week.    [provider]  ELIQUIS 5 MG TABS tablet TAKE 1 TABLET BY MOUTH TWICE A DAY 07/23/20   Worthy Rancher B, FNP  fluticasone [facility]) 50 MCG/ACT nasal spray Place 2 sprays into both nostrils daily as needed for allergies or rhinitis.    [provider]  furosemide (LASIX) 20 MG tablet TAKE 1 TABLET BY MOUTH EVERY DAY IN THE MORNING Patient taking differently: Take 20 mg by mouth in the morning. 07/09/20   Eulis Foster, FNP  KLOR-CON M10 10 MEQ tablet TAKE 1 TABLET BY MOUTH EVERY DAY 07/23/20   Worthy Rancher B, FNP  levothyroxine (SYNTHROID) 50 MCG tablet TAKE 1 TABLET BY MOUTH EVERY DAY Patient taking differently: Take 50 mcg by mouth daily before breakfast. 06/22/20   Sheliah Hatch, MD  metFORMIN (GLUCOPHAGE) 500 MG tablet TAKE 2 TABLETS BY MOUTH TWICE A DAY AS NEEDED 07/17/20   Sheliah Hatch, MD  metoprolol (TOPROL-XL) 200 MG 24 hr tablet TAKE 1 TABLET BY MOUTH NIGHTLY 07/23/20   Worthy Rancher B, FNP  Multiple Vitamin (MULTIVITAMIN) capsule Take 1 capsule by mouth daily.    [provider]  rosuvastatin (CRESTOR) 40 MG tablet TAKE 1 TABLET BY MOUTH EVERY DAY 07/23/20   Worthy Rancher B, FNP  sacubitril-valsartan (ENTRESTO) 24-26 MG Take 1 tablet by mouth 2 (two) times daily. Patient not taking: Reported on 07/17/2020    [provider]  tamoxifen (NOLVADEX) 10 MG tablet Take 1 tablet (10 mg total) by mouth daily. 07/13/20   Romero Belling, MD  TIADYLT ER 120 MG 24 hr capsule TAKE 1 CAPSULE BY MOUTH NIGHTLY. 07/23/20   Worthy Rancher B, FNP  tiotropium (SPIRIVA) 18 MCG inhalation capsule Place 18 mcg into inhaler and inhale daily.    [provider]    Family History Family History  Problem Relation Age of Onset  . Depression  Mother   . Heart disease Mother   . Hyperlipidemia Mother   . Stroke Mother   . Cancer Father   . Diabetes Father   . Early death Father   . Arthritis Sister   . Birth defects Sister   . Heart disease Sister   . Early death Brother   . Birth defects Brother   . Heart disease Brother   . Other Neg Hx        low testosterone    Social History Social History   Tobacco Use  . Smoking status: Former Smoker    Types: Cigarettes, Pipe  . Smokeless tobacco: Never Used  Vaping Use  . Vaping Use: Never used  Substance Use Topics  . Alcohol use: Yes    Alcohol/week: 20.0 standard drinks    Types: 14 Glasses of wine, 6 Cans of beer per week  . Drug  use: Never     Allergies   Lipitor [atorvastatin]   Review of Systems Review of Systems  Constitutional: Negative for chills and fever.  HENT: Positive for congestion. Negative for ear pain and sore throat.   Eyes: Negative for pain and visual disturbance.  Respiratory: Positive for cough and shortness of breath.   Cardiovascular: Negative for chest pain and palpitations.  Gastrointestinal: Negative for abdominal pain and vomiting.  Genitourinary: Negative for dysuria and hematuria.  Musculoskeletal: Negative for arthralgias and back pain.  Skin: Negative for color change and rash.  Neurological: Negative for seizures and syncope.  All other systems reviewed and are negative.    Physical Exam Triage Vital Signs ED Triage Vitals  Enc Vitals Group     BP 08/07/20 1141 111/65     Pulse Rate 08/07/20 1141 65     Resp 08/07/20 1141 19     Temp 08/07/20 1141 97.9 F (36.6 C)     Temp Source 08/07/20 1141 Oral     SpO2 08/07/20 1141 98 %     Weight --      Height --      Head Circumference --      Peak Flow --      Pain Score 08/07/20 1138 0     Pain Loc --      Pain Edu? --      Excl. in GC? --    No data found.  Updated Vital Signs BP 111/65 (BP Location: Right Arm)   Pulse 65   Temp 97.9 F (36.6 C) (Oral)    Resp 19   SpO2 98%   Visual Acuity Right Eye Distance:   Left Eye Distance:   Bilateral Distance:    Right Eye Near:   Left Eye Near:    Bilateral Near:     Physical Exam Vitals and nursing note reviewed.  Constitutional:      Appearance: He is well-developed. He is obese.  HENT:     Head: Normocephalic and atraumatic.     Mouth/Throat:     Mouth: Mucous membranes are moist.  Eyes:     Conjunctiva/sclera: Conjunctivae normal.  Cardiovascular:     Rate and Rhythm: Normal rate and regular rhythm.     Heart sounds: Normal heart sounds.  Pulmonary:     Effort: Tachypnea present. No respiratory distress.     Comments: Diminished breath sounds Abdominal:     Palpations: Abdomen is soft.     Tenderness: There is no abdominal tenderness.  Musculoskeletal:     Cervical back: Neck supple.  Skin:    General: Skin is warm and dry.  Neurological:     General: No focal deficit present.     Mental Status: He is alert and oriented to person, place, and time.  Psychiatric:        Mood and Affect: Mood normal.        Behavior: Behavior normal.      UC Treatments / Results  Labs (all labs ordered are listed, but only abnormal results are displayed) Labs Reviewed - No data to display  EKG   Radiology No results found.  Procedures Procedures (including critical care time)  Medications Ordered in UC Medications - No data to display  Initial Impression / Assessment and Plan / UC Course  I have reviewed the triage vital signs and the nursing notes.  Pertinent labs & imaging results that were available during my care of the patient were reviewed by me  and considered in my medical decision making (see chart for details).   Shortness of breath, hypercoagulable state, currently on bedrest.  Sending patient to the ED for evaluation.  He declines EMS and states he feels stable to drive himself.      Final Clinical Impressions(s) / UC Diagnoses   Final diagnoses:  Shortness  of breath  Hypercoagulable state (HCC)  Currently on bed rest     Discharge Instructions     Go to the emergency department for evaluation of your shortness of breath after being on bed rest with increased risk of blood clots.        ED Prescriptions    None     PDMP not reviewed this encounter.   Mickie Bail, NP 08/07/20 1231

## 2020-08-07 NOTE — ED Notes (Signed)
Patient and wife given discharge paperwork and prescription. Verbalized understanding of teaching. IV d/c with cath tip intact. Ambulatory to exit in NAD with steady gait.

## 2020-08-07 NOTE — Discharge Instructions (Signed)
Go to the emergency department for evaluation of your shortness of breath after being on bed rest with increased risk of blood clots.

## 2020-08-08 ENCOUNTER — Other Ambulatory Visit: Payer: Self-pay | Admitting: Family Medicine

## 2020-08-08 NOTE — ED Provider Notes (Signed)
[facility] EMERGENCY DEPARTMENT Provider Note   CSN: [id number] Arrival date & time: 08/07/20  1242     History Chief Complaint  Patient presents with  . Shortness of Breath    Douglas Cowan is a 75 y.o. male.  Presents to ER with concern for shortness of breath.  Approximately 1 week ago patient underwent ablation for A. fib.  Patient reports that since the procedure he believes he is remained in sinus rhythm.  Has not had any palpitations, no chest pain.  Has had a sore throat, as well as a mild cough and sensation of his chest feeling congested.  Endorses sensation of sinus congestion as well.  States that he has history of COPD and is chronically short of breath.  He does not believe he is any more short of breath than he normally is.  He endorses vigorous compliance with his Eliquis, denies any missed doses recently.  HPI     Past Medical History:  Diagnosis Date  . Abdominal aortic aneurysm (AAA) (HCC)   . Anxiety   . Arthritis   . BBB (bundle branch block)   . CAD (coronary artery disease)   . CHF (congestive heart failure) (HCC)   . COPD (chronic obstructive pulmonary disease) (HCC)   . CPAP (continuous positive airway pressure) dependence   . Depression   . DM (diabetes mellitus) (HCC)   . DOE (dyspnea on exertion)   . HLD (hyperlipidemia)   . Hypertension   . Obesity   . Orthopnea   . OSA (obstructive sleep apnea)   . Sleep apnea   . SOB (shortness of breath)   . Thyroid disease     Patient Active Problem List   Diagnosis Date Noted  . Low testosterone level in male 07/03/2020  . Daytime somnolence 04/16/2020  . Coronary artery disease involving native coronary artery of native heart without angina pectoris 03/15/2020  . HFrEF (heart failure with reduced ejection fraction) (HCC) 03/15/2020  . Persistent atrial fibrillation (HCC) 03/15/2020  . Morbid obesity (HCC) 03/15/2020  . Diabetes mellitus with coincident hypertension (HCC)  03/15/2020  . Chronic diastolic congestive heart failure (HCC) 09/29/2019  . Body mass index (BMI)40.0-44.9, adult 09/29/2019  . Hyperlipidemia associated with type 2 diabetes mellitus (HCC) 08/03/2018  . Anticoagulant long-term use 08/03/2018  . Other male erectile dysfunction 06/14/2018  . Abdominal aortic aneurysm (AAA) (HCC) 03/18/2018  . Type 2 diabetes mellitus without complication, with long-term current use of insulin (HCC) 02/11/2018  . Post herpetic neuralgia 02/11/2018  . Venous stasis 12/16/2017  . Adjustment insomnia 12/16/2017  . Pulmonary nodules 04/02/2017  . Dyspnea 11/13/2016  . Chronic obstructive pulmonary disease (HCC) 11/13/2016  . OSA on CPAP 06/19/2016  . Acquired hypothyroidism 04/28/2016    Past Surgical History:  Procedure Laterality Date  . ATRIAL FIBRILLATION ABLATION N/A 07/26/2020   Procedure: ATRIAL FIBRILLATION ABLATION;  Surgeon: Regan Lemming, MD;  [facility];  Service: Cardiovascular;  Laterality: N/A;  . CARDIOVERSION N/A 05/24/2020   Procedure: CARDIOVERSION;  Surgeon: Christell Constant, MD;  Location: [facility];  Service: Cardiovascular;  Laterality: N/A;  . CORONARY ANGIOPLASTY WITH STENT PLACEMENT    . VASCULAR SURGERY    . VASECTOMY         Family History  Problem Relation Age of Onset  . Depression Mother   . Heart disease Mother   . Hyperlipidemia Mother   . Stroke Mother   . Cancer Father   . Diabetes  Father   . Early death Father   . Arthritis Sister   . Birth defects Sister   . Heart disease Sister   . Early death Brother   . Birth defects Brother   . Heart disease Brother   . Other Neg Hx        low testosterone    Social History   Tobacco Use  . Smoking status: Former Smoker    Types: Cigarettes, Pipe  . Smokeless tobacco: Never Used  Vaping Use  . Vaping Use: Never used  Substance Use Topics  . Alcohol use: Yes    Alcohol/week: 20.0 standard drinks    Types: 14 Glasses of  wine, 6 Cans of beer per week  . Drug use: Never    Home Medications Prior to Admission medications   Medication Sig Start Date End Date Taking? Authorizing Provider  azithromycin (ZITHROMAX) 250 MG tablet Take 1 tablet (250 mg total) by mouth daily. Take first 2 tablets together, then 1 every day until finished. 08/07/20  Yes Milagros Lollykstra, Megen Madewell S, MD  ACCU-CHEK GUIDE test strip  03/26/20   [provider]  Accu-Chek Softclix Lancets lancets 1 each 3 (three) times daily. 04/02/20   [provider]  acetaminophen (TYLENOL) 500 MG tablet Take 500 mg by mouth every 6 (six) hours as needed for moderate pain.    [provider]  albuterol (VENTOLIN HFA) 108 (90 Base) MCG/ACT inhaler Inhale 2 puffs into the lungs every 6 (six) hours as needed for wheezing or shortness of breath. 04/02/20   [provider]  Dulaglutide (TRULICITY) 1.5 MG/0.5ML SOPN Inject 1.5 mg once weekly for 4 weeks then increase to 3 mg weekly Patient not taking: Reported on 07/17/2020 04/18/20   Waldon MerlMartin, William C, PA-C  Dulaglutide (TRULICITY) 3 MG/0.5ML SOPN Inject 3 mg into the skin once a week.    [provider]  ELIQUIS 5 MG TABS tablet TAKE 1 TABLET BY MOUTH TWICE A DAY 07/23/20   Worthy RancherWebb, Padonda B, FNP  fluticasone [facility](FLONASE) 50 MCG/ACT nasal spray Place 2 sprays into both nostrils daily as needed for allergies or rhinitis.    [provider]  furosemide (LASIX) 20 MG tablet TAKE 1 TABLET BY MOUTH EVERY DAY IN THE MORNING Patient taking differently: Take 20 mg by mouth in the morning. 07/09/20   Eulis FosterWebb, Padonda B, FNP  KLOR-CON M10 10 MEQ tablet TAKE 1 TABLET BY MOUTH EVERY DAY 07/23/20   Worthy RancherWebb, Padonda B, FNP  levothyroxine (SYNTHROID) 50 MCG tablet TAKE 1 TABLET BY MOUTH EVERY DAY Patient taking differently: Take 50 mcg by mouth daily before breakfast. 06/22/20   Sheliah Hatchabori, Katherine E, MD  metFORMIN (GLUCOPHAGE) 500 MG tablet TAKE 2 TABLETS BY MOUTH TWICE A DAY AS NEEDED 07/17/20   Sheliah Hatchabori,  Katherine E, MD  metoprolol (TOPROL-XL) 200 MG 24 hr tablet TAKE 1 TABLET BY MOUTH NIGHTLY 07/23/20   Worthy RancherWebb, Padonda B, FNP  Multiple Vitamin (MULTIVITAMIN) capsule Take 1 capsule by mouth daily.    [provider]  rosuvastatin (CRESTOR) 40 MG tablet TAKE 1 TABLET BY MOUTH EVERY DAY 07/23/20   Worthy RancherWebb, Padonda B, FNP  sacubitril-valsartan (ENTRESTO) 24-26 MG Take 1 tablet by mouth 2 (two) times daily. Patient not taking: Reported on 07/17/2020    [provider]  tamoxifen (NOLVADEX) 10 MG tablet Take 1 tablet (10 mg total) by mouth daily. 07/13/20   Romero BellingEllison, Sean, MD  TIADYLT ER 120 MG 24 hr capsule TAKE 1 CAPSULE BY MOUTH NIGHTLY. 07/23/20  Worthy Rancher B, FNP  tiotropium (SPIRIVA) 18 MCG inhalation capsule Place 18 mcg into inhaler and inhale daily.    [provider]    Allergies    Lipitor [atorvastatin]  Review of Systems   Review of Systems  Constitutional: Negative for chills and fever.  HENT: Positive for sore throat. Negative for ear pain.   Eyes: Negative for pain and visual disturbance.  Respiratory: Positive for cough and shortness of breath.   Cardiovascular: Negative for chest pain and palpitations.  Gastrointestinal: Negative for abdominal pain and vomiting.  Genitourinary: Negative for dysuria and hematuria.  Musculoskeletal: Negative for arthralgias and back pain.  Skin: Negative for color change and rash.  Neurological: Negative for seizures and syncope.  All other systems reviewed and are negative.   Physical Exam Updated Vital Signs BP 137/70   Pulse 69   Temp 97.8 F (36.6 C) (Oral)   Resp 16   SpO2 98%   Physical Exam Vitals and nursing note reviewed.  Constitutional:      Appearance: He is well-developed.  HENT:     Head: Normocephalic and atraumatic.     Mouth/Throat:     Mouth: Mucous membranes are moist.     Pharynx: Oropharynx is clear.     Comments: Clear Eyes:     Conjunctiva/sclera: Conjunctivae normal.  Cardiovascular:      Rate and Rhythm: Normal rate and regular rhythm.     Heart sounds: No murmur heard.   Pulmonary:     Effort: Pulmonary effort is normal. No respiratory distress.     Breath sounds: Normal breath sounds.  Abdominal:     Palpations: Abdomen is soft.     Tenderness: There is no abdominal tenderness.  Musculoskeletal:     Cervical back: Neck supple.     Right lower leg: No edema.     Left lower leg: No edema.  Skin:    General: Skin is warm and dry.  Neurological:     Mental Status: He is alert.     ED Results / Procedures / Treatments   Labs (all labs ordered are listed, but only abnormal results are displayed) Labs Reviewed  COMPREHENSIVE METABOLIC PANEL - Abnormal; Notable for the following components:      Result Value   Glucose, Bld 218 (*)    All other components within normal limits  CBC WITH DIFFERENTIAL/PLATELET  BRAIN NATRIURETIC PEPTIDE  TROPONIN I (HIGH SENSITIVITY)  TROPONIN I (HIGH SENSITIVITY)    EKG EKG Interpretation  Date/Time:  Tuesday August 07 2020 13:09:56 EDT Ventricular Rate:  59 PR Interval:  180 QRS Duration: 142 QT Interval:  486 QTC Calculation: 481 R Axis:   -53 Text Interpretation: Sinus bradycardia Left axis deviation Right bundle branch block Abnormal ECG Confirmed by Marianna Fuss ([id number]) on 08/07/2020 8:32:04 PM   Radiology DG Chest 2 View  Result Date: 08/07/2020 CLINICAL DATA:  Shortness of breath. Chest congestion with cough for 5 days. EXAM: CHEST - 2 VIEW COMPARISON:  07/19/2020 coronary CTA FINDINGS: Midline trachea. Mild cardiomegaly. Atherosclerosis in the transverse aorta. No pleural effusion or pneumothorax. Clear lungs. No congestive failure. IMPRESSION: Cardiomegaly without congestive failure. Aortic Atherosclerosis (ICD10-I70.0). Electronically Signed   By: Jeronimo Greaves M.D.   On: 08/07/2020 14:17    Procedures Procedures   Medications Ordered in ED Medications - No data to display  ED Course  I have  reviewed the triage vital signs and the nursing notes.  Pertinent labs & imaging results that were  available during my care of the patient were reviewed by me and considered in my medical decision making (see chart for details).    MDM Rules/Calculators/A&P                         75 year old with history of A. fib, recent ablation presenting to ER with concern for sore throat, chest congestion, cough.  On exam patient is well-appearing in no distress.  Vital signs are stable.  He was seen at urgent care and sent to ER with concern for increased risk of blood clots.  Patient states that he has been resting and taking it quite easy but he still gets up and moves around the house.  He is not on strict bed rest.  He has no leg symptoms or leg pain and legs appear normal on exam.  He endorses vigorous compliance with his anticoagulation that is prescribed for his atrial fibrillation.  Given he is on anticoagulation, he has a reassuring exam, normal vital signs, no tachypnea, tachycardia, hypoxia, I have an exceedingly low suspicion for pulmonary embolism at this time.  I discussed the case with cardiology on-call, Joyce Gross.  He does not have any concerns for postoperative complications.  Given possibility of possible sinus infection, bronchitis, will provide Rx for Z-Pak.  Recommend recheck with both his primary doctor and cardiologist.  Reviewed return precautions and discharged home.   After the discussed management above, the patient was determined to be safe for discharge.  The patient was in agreement with this plan and all questions regarding their care were answered.  ED return precautions were discussed and the patient will return to the ED with any significant worsening of condition.   Final Clinical Impression(s) / ED Diagnoses Final diagnoses:  Upper respiratory tract infection, unspecified type    Rx / DC Orders ED Discharge Orders         Ordered    azithromycin (ZITHROMAX) 250 MG tablet   Daily        08/07/20 2244           Milagros Loll, MD 08/08/20 [phone]

## 2020-08-23 ENCOUNTER — Encounter (HOSPITAL_COMMUNITY): Payer: Self-pay | Admitting: Nurse Practitioner

## 2020-08-23 ENCOUNTER — Other Ambulatory Visit: Payer: Self-pay

## 2020-08-23 ENCOUNTER — Ambulatory Visit (HOSPITAL_COMMUNITY)
Admission: RE | Admit: 2020-08-23 | Discharge: 2020-08-23 | Disposition: A | Discharge status: Home / Self Care | Payer: Medicare PPO | Source: Ambulatory Visit | Attending: Nurse Practitioner | Admitting: Nurse Practitioner

## 2020-08-23 VITALS — BP 102/60 | HR 54 | Ht 71.0 in | Wt 283.6 lb

## 2020-08-23 DIAGNOSIS — D6869 Other thrombophilia: Secondary | ICD-10-CM

## 2020-08-23 DIAGNOSIS — I2510 Atherosclerotic heart disease of native coronary artery without angina pectoris: Secondary | ICD-10-CM | POA: Insufficient documentation

## 2020-08-23 DIAGNOSIS — Z7984 Long term (current) use of oral hypoglycemic drugs: Secondary | ICD-10-CM | POA: Insufficient documentation

## 2020-08-23 DIAGNOSIS — G4733 Obstructive sleep apnea (adult) (pediatric): Secondary | ICD-10-CM | POA: Insufficient documentation

## 2020-08-23 DIAGNOSIS — Z7901 Long term (current) use of anticoagulants: Secondary | ICD-10-CM | POA: Diagnosis not present

## 2020-08-23 DIAGNOSIS — Z955 Presence of coronary angioplasty implant and graft: Secondary | ICD-10-CM | POA: Diagnosis not present

## 2020-08-23 DIAGNOSIS — I4891 Unspecified atrial fibrillation: Secondary | ICD-10-CM | POA: Diagnosis not present

## 2020-08-23 DIAGNOSIS — Z9989 Dependence on other enabling machines and devices: Secondary | ICD-10-CM | POA: Diagnosis not present

## 2020-08-23 DIAGNOSIS — Z79890 Hormone replacement therapy (postmenopausal): Secondary | ICD-10-CM | POA: Diagnosis not present

## 2020-08-23 DIAGNOSIS — I509 Heart failure, unspecified: Secondary | ICD-10-CM | POA: Insufficient documentation

## 2020-08-23 DIAGNOSIS — I480 Paroxysmal atrial fibrillation: Secondary | ICD-10-CM

## 2020-08-23 DIAGNOSIS — Z79899 Other long term (current) drug therapy: Secondary | ICD-10-CM | POA: Insufficient documentation

## 2020-08-23 DIAGNOSIS — Z888 Allergy status to other drugs, medicaments and biological substances status: Secondary | ICD-10-CM | POA: Diagnosis not present

## 2020-08-23 DIAGNOSIS — Z87891 Personal history of nicotine dependence: Secondary | ICD-10-CM | POA: Diagnosis not present

## 2020-08-23 DIAGNOSIS — E119 Type 2 diabetes mellitus without complications: Secondary | ICD-10-CM | POA: Diagnosis not present

## 2020-08-23 NOTE — Progress Notes (Signed)
Primary Care Physician: Patient, No Pcp Per (Inactive) Referring Physician: Dr. Cleophas Dunker Zurcher is a 75 y.o. male with a h/o CAD, HF,DM, OSA on CPAP, that underwent an ablation one month ago. He has not noted any afib. No swallowing or groin issues. He feels well. continues on eliquis 5 mg with a CHA2DS2VASc score of 5.   Today, he denies symptoms of palpitations, chest pain, shortness of breath, orthopnea, PND, lower extremity edema, dizziness, presyncope, syncope, or neurologic sequela. The patient is tolerating medications without difficulties and is otherwise without complaint today.   Past Medical History:  Diagnosis Date  . Abdominal aortic aneurysm (AAA) (HCC)   . Anxiety   . Arthritis   . BBB (bundle branch block)   . CAD (coronary artery disease)   . CHF (congestive heart failure) (HCC)   . COPD (chronic obstructive pulmonary disease) (HCC)   . CPAP (continuous positive airway pressure) dependence   . Depression   . DM (diabetes mellitus) (HCC)   . DOE (dyspnea on exertion)   . HLD (hyperlipidemia)   . Hypertension   . Obesity   . Orthopnea   . OSA (obstructive sleep apnea)   . Sleep apnea   . SOB (shortness of breath)   . Thyroid disease    Past Surgical History:  Procedure Laterality Date  . ATRIAL FIBRILLATION ABLATION N/A 07/26/2020   Procedure: ATRIAL FIBRILLATION ABLATION;  Surgeon: Regan Lemming, MD;  [facility];  Service: Cardiovascular;  Laterality: N/A;  . CARDIOVERSION N/A 05/24/2020   Procedure: CARDIOVERSION;  Surgeon: Christell Constant, MD;  Location: [facility];  Service: Cardiovascular;  Laterality: N/A;  . CORONARY ANGIOPLASTY WITH STENT PLACEMENT    . VASCULAR SURGERY    . VASECTOMY      Current Outpatient Medications  Medication Sig Dispense Refill  . ACCU-CHEK GUIDE test strip     . Accu-Chek Softclix Lancets lancets 1 each 3 (three) times daily.    Marland Kitchen acetaminophen (TYLENOL) 500 MG tablet Take 500 mg  by mouth every 6 (six) hours as needed for moderate pain.    Marland Kitchen albuterol (VENTOLIN HFA) 108 (90 Base) MCG/ACT inhaler Inhale 2 puffs into the lungs every 6 (six) hours as needed for wheezing or shortness of breath.    . Dulaglutide (TRULICITY) 3 MG/0.5ML SOPN Inject 3 mg into the skin once a week.    Marland Kitchen ELIQUIS 5 MG TABS tablet TAKE 1 TABLET BY MOUTH TWICE A DAY 180 tablet 0  . fluticasone (FLONASE) 50 MCG/ACT nasal spray Place 2 sprays into both nostrils daily as needed for allergies or rhinitis.    . furosemide (LASIX) 20 MG tablet TAKE 1 TABLET BY MOUTH EVERY DAY IN THE MORNING 90 tablet 0  . KLOR-CON M10 10 MEQ tablet TAKE 1 TABLET BY MOUTH EVERY DAY 90 tablet 0  . levothyroxine (SYNTHROID) 50 MCG tablet TAKE 1 TABLET BY MOUTH EVERY DAY 90 tablet 0  . metFORMIN (GLUCOPHAGE) 500 MG tablet TAKE 2 TABLETS BY MOUTH TWICE A DAY AS NEEDED 120 tablet 1  . metoprolol (TOPROL-XL) 200 MG 24 hr tablet TAKE 1 TABLET BY MOUTH NIGHTLY 90 tablet 0  . Multiple Vitamin (MULTIVITAMIN) capsule Take 1 capsule by mouth daily.    . rosuvastatin (CRESTOR) 40 MG tablet TAKE 1 TABLET BY MOUTH EVERY DAY 90 tablet 0  . sacubitril-valsartan (ENTRESTO) 24-26 MG Take 1 tablet by mouth 2 (two) times daily.    . tamoxifen (NOLVADEX) 10  MG tablet Take 1 tablet (10 mg total) by mouth daily. 90 tablet 3  . TIADYLT ER 120 MG 24 hr capsule TAKE 1 CAPSULE BY MOUTH NIGHTLY. 90 capsule 0  . tiotropium (SPIRIVA) 18 MCG inhalation capsule Place 18 mcg into inhaler and inhale daily.     No current facility-administered medications for this encounter.    Allergies  Allergen Reactions  . Lipitor [Atorvastatin] Other (See Comments)    Myalgias, flu like symptoms    Social History   Socioeconomic History  . Marital status: Married    Spouse name: Not on file  . Number of children: Not on file  . Years of education: Not on file  . Highest education level: Not on file  Occupational History  . Not on file  Tobacco Use  .  Smoking status: Former Smoker    Types: Cigarettes, Pipe  . Smokeless tobacco: Never Used  Vaping Use  . Vaping Use: Never used  Substance and Sexual Activity  . Alcohol use: Yes    Alcohol/week: 20.0 standard drinks    Types: 14 Glasses of wine, 6 Cans of beer per week  . Drug use: Never  . Sexual activity: Yes    Birth control/protection: Surgical    Comment: Vascetomy  Other Topics Concern  . Not on file  Social History Narrative  . Not on file   Social Determinants of Health   Financial Resource Strain: Not on file  Food Insecurity: Not on file  Transportation Needs: Not on file  Physical Activity: Not on file  Stress: Not on file  Social Connections: Not on file  Intimate Partner Violence: Not on file    Family History  Problem Relation Age of Onset  . Depression Mother   . Heart disease Mother   . Hyperlipidemia Mother   . Stroke Mother   . Cancer Father   . Diabetes Father   . Early death Father   . Arthritis Sister   . Birth defects Sister   . Heart disease Sister   . Early death Brother   . Birth defects Brother   . Heart disease Brother   . Other Neg Hx        low testosterone    ROS- All systems are reviewed and negative except as per the HPI above  Physical Exam: Vitals:   08/23/20 0914  BP: 102/60  Pulse: (!) 54  Weight: 128.6 kg  Height: 5\' 11"  (1.803 m)   Wt Readings from Last 3 Encounters:  08/23/20 128.6 kg  07/26/20 124.7 kg  07/17/20 124.7 kg    Labs: Lab Results  Component Value Date   NA 138 08/07/2020   K 4.0 08/07/2020   CL 104 08/07/2020   CO2 26 08/07/2020   GLUCOSE 218 (H) 08/07/2020   BUN 9 08/07/2020   CREATININE 1.01 08/07/2020   CALCIUM 9.2 08/07/2020   MG 1.9 04/16/2020   No results found for: INR Lab Results  Component Value Date   CHOL 135 04/11/2020   HDL 41.50 04/11/2020   LDLCALC 58 04/11/2020   TRIG 178.0 (H) 04/11/2020     GEN- The patient is well appearing, alert and oriented x 3 today.    Head- normocephalic, atraumatic Eyes-  Sclera clear, conjunctiva pink Ears- hearing intact Oropharynx- clear Neck- supple, no JVP Lymph- no cervical lymphadenopathy Lungs- Clear to ausculation bilaterally, normal work of breathing Heart- Regular rate and rhythm, no murmurs, rubs or gallops, PMI not laterally displaced GI- soft, NT,  ND, + BS Extremities- no clubbing, cyanosis, or edema MS- no significant deformity or atrophy Skin- no rash or lesion Psych- euthymic mood, full affect Neuro- strength and sensation are intact  EKG-sinus brady with sinus arrhythmia at 54 bpm. Pr int 196 ms, qrs int 142 ms, qtc 458 ms     Assessment and Plan: 1. Afib S/p ablation 07/26/20 Maintaining  SR Continue  metoprolol 200 mg daily  Continue  diltiazem 120 mg daily   2. CHA2DS2VASc of 5  Continue eliquis 5 mg bid  Reminded not to stop for any elective procedure during the next 2 months  3. CAD No anginal symptoms   4. HF Normovolemic   F/u with Dr. Elberta Fortis 6/10  [facility] Sidle. [facility] [address] [place], [place] [address] [phone]

## 2020-09-03 ENCOUNTER — Encounter (HOSPITAL_BASED_OUTPATIENT_CLINIC_OR_DEPARTMENT_OTHER): Payer: Medicare PPO | Admitting: Cardiology

## 2020-09-08 ENCOUNTER — Other Ambulatory Visit: Payer: Self-pay | Admitting: Family Medicine

## 2020-09-17 NOTE — Progress Notes (Signed)
Cardiology Office Note:    Date:  09/18/2020   ID:  Douglas Cowan, DOB 01/28/1946, MRN [id number]  [facility] Cardiologist:  Riley LamMahesh Kaislyn Gulas MD  CC: Follow up HF  History of Present Illness:    Douglas Cowan is a 75 y.o. male with a hx of DM,  CAD with prior stents in 2011, [facility] D2 80% (bifurcation lesion) prox LCx 60%.  Prior atrial flutter with ablation 8 years ago complicated by asystole per patient with long standing paroxysm atrial fibrillation CHADSVASC 4, question of HFrEF EF 35-40% with inferior WMA and [facility] 01/02/[facility].   Diabetes, Morbid Obesity.  Presented to establish care 03/15/20. Presented to DOD 03/27/20 with CP.  Stopped Entresto for hypotension (challenged X 2).  Had DCCV 05/24/20 with successful conversion to SR.    In interim of this visit, patient had ablation 07/26/20.  Still in SR at 08/23/20 assessment. Seen 09/18/20.  EF had recovered.  Patient notes that he is doing great.  Since last visit notes having an upper chest infrection.  Off Entresto for low BP (second challenge).  There are no interval hospital/ED visit.    No chest pain or pressure.  No SOB/DOE and no PND/Orthopnea.  No weight gain (273-275) or leg swelling.  No palpitations or syncope.   Past Medical History:  Diagnosis Date  . Abdominal aortic aneurysm (AAA) (HCC)   . Anxiety   . Arthritis   . BBB (bundle branch block)   . CAD (coronary artery disease)   . CHF (congestive heart failure) (HCC)   . COPD (chronic obstructive pulmonary disease) (HCC)   . CPAP (continuous positive airway pressure) dependence   . Depression   . DM (diabetes mellitus) (HCC)   . DOE (dyspnea on exertion)   . HLD (hyperlipidemia)   . Hypertension   . Obesity   . Orthopnea   . OSA (obstructive sleep apnea)   . Sleep apnea   . SOB (shortness of breath)   . Thyroid disease     Past Surgical History:  Procedure Laterality Date  . ATRIAL  FIBRILLATION ABLATION N/A 07/26/2020   Procedure: ATRIAL FIBRILLATION ABLATION;  Surgeon: Regan Lemmingamnitz, Will Martin, MD;  [facility];  Service: Cardiovascular;  Laterality: N/A;  . CARDIOVERSION N/A 05/24/2020   Procedure: CARDIOVERSION;  Surgeon: Christell Constanthandrasekhar, Bethany Hirt A, MD;  Location: [facility];  Service: Cardiovascular;  Laterality: N/A;  . CORONARY ANGIOPLASTY WITH STENT PLACEMENT    . VASCULAR SURGERY    . VASECTOMY      Current Medications: Current Meds  Medication Sig  . ACCU-CHEK GUIDE test strip   . Accu-Chek Softclix Lancets lancets 1 each 3 (three) times daily.  Marland Kitchen. acetaminophen (TYLENOL) 500 MG tablet Take 500 mg by mouth every 6 (six) hours as needed for moderate pain.  Marland Kitchen. albuterol (VENTOLIN HFA) 108 (90 Base) MCG/ACT inhaler Inhale 2 puffs into the lungs every 6 (six) hours as needed for wheezing or shortness of breath.  . Dulaglutide (TRULICITY) 3 MG/0.5ML SOPN Inject 3 mg into the skin once a week.  Marland Kitchen. ELIQUIS 5 MG TABS tablet TAKE 1 TABLET BY MOUTH TWICE A DAY  . fluticasone (FLONASE) 50 MCG/ACT nasal spray Place 2 sprays into both nostrils daily as needed for allergies or rhinitis.  . furosemide (LASIX) 20 MG tablet TAKE 1 TABLET BY MOUTH EVERY DAY IN THE MORNING  . KLOR-CON M10 10 MEQ tablet TAKE 1 TABLET BY MOUTH EVERY  DAY  . levothyroxine (SYNTHROID) 50 MCG tablet TAKE 1 TABLET BY MOUTH EVERY DAY  . metFORMIN (GLUCOPHAGE) 500 MG tablet TAKE 2 TABLETS BY MOUTH TWICE A DAY AS NEEDED  . metoprolol (TOPROL-XL) 200 MG 24 hr tablet TAKE 1 TABLET BY MOUTH NIGHTLY  . Multiple Vitamin (MULTIVITAMIN) capsule Take 1 capsule by mouth daily.  . rosuvastatin (CRESTOR) 40 MG tablet TAKE 1 TABLET BY MOUTH EVERY DAY  . tamoxifen (NOLVADEX) 10 MG tablet Take 1 tablet (10 mg total) by mouth daily.  Chelsea Aus ER 120 MG 24 hr capsule TAKE 1 CAPSULE BY MOUTH NIGHTLY.  Marland Kitchen tiotropium (SPIRIVA) 18 MCG inhalation capsule Place 18 mcg into inhaler and inhale daily.    Allergies:    Lipitor [atorvastatin]   Social History   Socioeconomic History  . Marital status: Married    Spouse name: Not on file  . Number of children: Not on file  . Years of education: Not on file  . Highest education level: Not on file  Occupational History  . Not on file  Tobacco Use  . Smoking status: Former Smoker    Types: Cigarettes, Pipe  . Smokeless tobacco: Never Used  Vaping Use  . Vaping Use: Never used  Substance and Sexual Activity  . Alcohol use: Yes    Alcohol/week: 20.0 standard drinks    Types: 14 Glasses of wine, 6 Cans of beer per week  . Drug use: Never  . Sexual activity: Yes    Birth control/protection: Surgical    Comment: Vascetomy  Other Topics Concern  . Not on file  Social History Narrative  . Not on file   Social Determinants of Health   Financial Resource Strain: Not on file  Food Insecurity: Not on file  Transportation Needs: Not on file  Physical Activity: Not on file  Stress: Not on file  Social Connections: Not on file    Family History: The patient's notes heart disease in her family Sister had valve replacement with at age the age of 37 Brother had prior valve repair NOS  ROS:   Please see the history of present illness.    All other systems reviewed and are negative.  EKGs/Labs/Other Studies Reviewed:    The following studies were reviewed today:  EKG:   06/15/20: Sinus bradycardia 56 RBBB  Transthoracic Echocardiogram: Date: Improvement in LVEF with atrial functional MR Results: 1. Left ventricular ejection fraction, by estimation, is 50% with beat to  beat variability. The left ventricle has low normal function. Left  ventricular endocardial border not optimally defined to evaluate regional  wall motion despite the use of  Definity. Inferior wall appears akinetic in short axis, inferolateral wall  likely severely hypokinetic. There is mild left ventricular hypertrophy.  Left ventricular diastolic parameters are  indeterminate.  2. Right ventricular systolic function is mildly reduced. The right  ventricular size is moderately enlarged. There is borderline mild  pulmonary artery systolic pressure. The estimated right ventricular  systolic pressure is 35.3 mmHg.  3. Left atrial size was mild to moderately dilated.  4. Mitral valve regurgitation likely eccentric and therefore may be  underestimated, with evidence of splay artifact. MR is likely  mild-moderate to moderate. The mitral valve is degenerative. Mild to  moderate mitral valve regurgitation. No evidence of  mitral stenosis. Moderate mitral annular calcification.  5. The aortic valve is grossly normal. There is mild calcification of the  aortic valve. Aortic valve regurgitation is not visualized.  6. The inferior vena  cava is normal in size with greater than 50%  respiratory variability, suggesting right atrial pressure of 3 mmHg.   August 2021 Outside Films Personal Reviewed CD: [facility] LCP and Echo- has 80% ostial D2 lesion, medium sized lesion.  EF 30-35% moderate LA.  Would be difficult intervention.  Cardiac PV CT: Date: Results: IMPRESSION: 1. There is atypical pulmonary vein drainage into the left atrium (2 on the right merge to a common ostium and 2 on the left).  2. The left atrial appendage is large - mixed chicken wing one dominant lobe and ostial size 39 mm x 25 mm and length 48 mm mm. There is no thrombus in the left atrial appendage.  3. The esophagus runs in the left atrial midline and is not in the proximity to any of the pulmonary veins.  4. Notable left atrial dilation- sub-optimal filling for 3D modeling likely related to this dilation.  5. Aortic atherosclerosis noted.  6. Mitral Valve Calcification is prominent.   Left Right Heart Catheterizations: Date: August 2021 Outside Films Results: Old Records from [facility] August 2021. LCP [facility]/03/[facility] at [facility] with  D2 80% prox LCx 60%.  Prior atrial flutter with ablation 8 years ago complicated by asystole per patient with long standing persistent atrial fibrillation CHADSVASC 4.    Physical Exam:    VS:  BP 102/60   Pulse 60   Ht 5\' 11"  (1.803 m)   Wt 284 lb (128.8 kg)   SpO2 96%   BMI 39.61 kg/m     Wt Readings from Last 3 Encounters:  09/18/20 284 lb (128.8 kg)  08/23/20 283 lb 9.6 oz (128.6 kg)  07/26/20 275 lb (124.7 kg)    GEN:  Obese, well developed in no acute distress HEENT: Normal NECK: No JVD; No carotid bruits LYMPHATICS: No lymphadenopathy CARDIAC: Regular rhythm, no murmurs, rubs, gallops RESPIRATORY:  Clear to auscultation without rales, wheezing or rhonchi  ABDOMEN: Soft, non-tender, non-distended MUSCULOSKELETAL:  +1 edema bilaterally; No deformity  SKIN: Warm and dry NEUROLOGIC:  Alert and oriented x 3 PSYCHIATRIC:  Normal affect   ASSESSMENT:    1. HFrEF (heart failure with reduced ejection fraction) (HCC)   2. Morbid obesity (HCC)   3. Diabetes mellitus with coincident hypertension (HCC)   4. PAF (paroxysmal atrial fibrillation) (HCC)   5. Coronary artery disease involving native coronary artery of native heart without angina pectoris    PLAN:    In order of problems listed above:  Chronic Heart Failure Recovered EF Morbid Obesity DM with HTN - NYHA class II, Stage B, slightly hypervolemic, etiology from CAD vs AF mixed pictures - Will continue lasix 20 mg PO daily - Strict I/Os, daily weights, and fluid restriction of < 2 L  - Continue Toprol Xl 100 mg - No ARNI  - Will check BMP and BNP; will plan on SGLT2i start based on results  - not on K; have taken it off MAR before  Coronary Artery Disease and HLD Anatomy: Obstructive in Diag and OM2 borderline but technically difficult (discussed with IC i in 2021) - Asymptomatic - Continue Eliquis (if stopped would switch to ASA) - continue statin - continue BB as above  Paroxysmal Atrial Fibrillation s/p   CHADSVASC 4 Daytime Somnolense  - BB and AC as above - likely stop CCB at next visit  Abdominal Aortic Aneurysm - will get Duplex 2022 in 06/2021  Winter follow up unless new symptoms or abnormal test results  warranting change in plan Patient will eventually move back to [facility]; at this time would still like to see me as primary cardiologist  Would be reasonable for  APP Follow up   Medication Adjustments/Labs and Tests Ordered: Current medicines are reviewed at length with the patient today.  Concerns regarding medicines are outlined above.  Orders Placed This Encounter  Procedures  . Basic metabolic panel  . Pro b natriuretic peptide (BNP)   No orders of the defined types were placed in this encounter.   Patient Instructions  Medication Instructions:  Your physician recommends that you continue on your current medications as directed. Please refer to the Current Medication list given to you today.  *If you need a refill on your cardiac medications before your next appointment, please call your pharmacy*   Lab Work: TODAY: BNP and BMP If you have labs (blood work) drawn today and your tests are completely normal, you will receive your results only by: Marland Kitchen MyChart Message (if you have MyChart) OR . A paper copy in the mail If you have any lab test that is abnormal or we need to change your treatment, we will call you to review the results.   Testing/Procedures: NONE   Follow-Up: At [facility], you and your health needs are our priority.  As part of our continuing mission to provide you with exceptional heart care, we have created designated Provider Care Teams.  These Care Teams include your primary Cardiologist (physician) and Advanced Practice Providers (APPs -  Physician Assistants and Nurse Practitioners) who all work together to provide you with the care you need, when you need it.  We recommend signing up for the patient portal called "MyChart".  Sign up  information is provided on this After Visit Summary.  MyChart is used to connect with patients for Virtual Visits (Telemedicine).  Patients are able to view lab/test results, encounter notes, upcoming appointments, etc.  Non-urgent messages can be sent to your provider as well.   To learn more about what you can do with MyChart, go to [email or web address].    Your next appointment:   6-7 month(s)  The format for your next appointment:   In Person  Provider:   You may see Riley Lam, MD or one of the following Advanced Practice Providers on your designated Care Team:    Ronie Spies, PA-C  Jacolyn Reedy, PA-C         Signed, Christell Constant, MD  09/18/2020 10:41 AM    [facility]

## 2020-09-18 ENCOUNTER — Ambulatory Visit: Payer: Medicare PPO | Admitting: Internal Medicine

## 2020-09-18 ENCOUNTER — Other Ambulatory Visit: Payer: Self-pay

## 2020-09-18 ENCOUNTER — Encounter: Payer: Self-pay | Admitting: Internal Medicine

## 2020-09-18 VITALS — BP 102/60 | HR 60 | Ht 71.0 in | Wt 284.0 lb

## 2020-09-18 DIAGNOSIS — I10 Essential (primary) hypertension: Secondary | ICD-10-CM

## 2020-09-18 DIAGNOSIS — G4733 Obstructive sleep apnea (adult) (pediatric): Secondary | ICD-10-CM

## 2020-09-18 DIAGNOSIS — I480 Paroxysmal atrial fibrillation: Secondary | ICD-10-CM | POA: Diagnosis not present

## 2020-09-18 DIAGNOSIS — I5020 Unspecified systolic (congestive) heart failure: Secondary | ICD-10-CM

## 2020-09-18 DIAGNOSIS — E119 Type 2 diabetes mellitus without complications: Secondary | ICD-10-CM

## 2020-09-18 DIAGNOSIS — I714 Abdominal aortic aneurysm, without rupture, unspecified: Secondary | ICD-10-CM

## 2020-09-18 DIAGNOSIS — I2510 Atherosclerotic heart disease of native coronary artery without angina pectoris: Secondary | ICD-10-CM

## 2020-09-18 DIAGNOSIS — Z9989 Dependence on other enabling machines and devices: Secondary | ICD-10-CM

## 2020-09-18 NOTE — Patient Instructions (Signed)
Medication Instructions:  Your physician recommends that you continue on your current medications as directed. Please refer to the Current Medication list given to you today.  *If you need a refill on your cardiac medications before your next appointment, please call your pharmacy*   Lab Work: TODAY: BNP and BMP If you have labs (blood work) drawn today and your tests are completely normal, you will receive your results only by: Marland Kitchen MyChart Message (if you have MyChart) OR . A paper copy in the mail If you have any lab test that is abnormal or we need to change your treatment, we will call you to review the results.   Testing/Procedures: NONE   Follow-Up: At [facility], you and your health needs are our priority.  As part of our continuing mission to provide you with exceptional heart care, we have created designated Provider Care Teams.  These Care Teams include your primary Cardiologist (physician) and Advanced Practice Providers (APPs -  Physician Assistants and Nurse Practitioners) who all work together to provide you with the care you need, when you need it.  We recommend signing up for the patient portal called "MyChart".  Sign up information is provided on this After Visit Summary.  MyChart is used to connect with patients for Virtual Visits (Telemedicine).  Patients are able to view lab/test results, encounter notes, upcoming appointments, etc.  Non-urgent messages can be sent to your provider as well.   To learn more about what you can do with MyChart, go to [email or web address].    Your next appointment:   6-7 month(s)  The format for your next appointment:   In Person  Provider:   You may see Riley Lam, MD or one of the following Advanced Practice Providers on your designated Care Team:    Ronie Spies, PA-C  Jacolyn Reedy, PA-C

## 2020-09-19 ENCOUNTER — Other Ambulatory Visit: Payer: Self-pay | Admitting: Family Medicine

## 2020-09-19 DIAGNOSIS — E039 Hypothyroidism, unspecified: Secondary | ICD-10-CM

## 2020-09-19 LAB — BASIC METABOLIC PANEL
BUN/Creatinine Ratio: 13 (ref 10–24)
BUN: 14 mg/dL (ref 8–27)
CO2: 22 mmol/L (ref 20–29)
Calcium: 9.5 mg/dL (ref 8.6–10.2)
Chloride: 98 mmol/L (ref 96–106)
Creatinine, Ser: 1.08 mg/dL (ref 0.76–1.27)
Glucose: 212 mg/dL — ABNORMAL HIGH (ref 65–99)
Potassium: 4.5 mmol/L (ref 3.5–5.2)
Sodium: 138 mmol/L (ref 134–144)
eGFR: 73 mL/min/{1.73_m2} (ref >59)

## 2020-09-19 LAB — PRO B NATRIURETIC PEPTIDE: NT-Pro BNP: 224 pg/mL (ref 0–376)

## 2020-09-20 ENCOUNTER — Telehealth: Payer: Self-pay

## 2020-09-20 MED ORDER — EMPAGLIFLOZIN 10 MG PO TABS: 10.0000 mg | ORAL_TABLET | Freq: Every day | ORAL | 3 refills | Status: AC

## 2020-09-20 NOTE — Telephone Encounter (Addendum)
The patient has been notified of the result and verbalized understanding.   All questions (if any) were answered. Arvid Right Haylen Shelnutt, RN 09/20/2020 3:30 PM   Jardiance coupon for 14 day free trial and a 30 day sample to be left a front desk for patient to pickup.  Detailed message left on voicemail with details ok per DPR.

## 2020-09-20 NOTE — Telephone Encounter (Signed)
-----   Message from Awilda Metro, RPH-CPP sent at 09/20/2020  3:03 PM EDT ----- Douglas Cowan is Tier 2 on his insurance and is cheaper than Farxiga (Tier 3). Copay for Jardiance is $40/1 month but would send in for 3 month supply for cost savings - copay is $80/3 months. He may already be in the donut hole since he takes a few other branded meds like Eliquis and Trulicity in which case his copay may be higher. Dose would be Jardiance 10mg  once daily.

## 2020-09-21 ENCOUNTER — Other Ambulatory Visit: Payer: Self-pay | Admitting: Family

## 2020-09-29 ENCOUNTER — Other Ambulatory Visit: Payer: Self-pay | Admitting: Family Medicine

## 2020-10-07 ENCOUNTER — Other Ambulatory Visit: Payer: Self-pay | Admitting: Family

## 2020-10-26 ENCOUNTER — Ambulatory Visit: Payer: Medicare PPO | Admitting: Cardiology

## 2020-10-26 ENCOUNTER — Encounter: Payer: Self-pay | Admitting: Cardiology

## 2020-10-26 ENCOUNTER — Other Ambulatory Visit: Payer: Self-pay

## 2020-10-26 VITALS — BP 114/70 | HR 52 | Ht 71.0 in | Wt 277.0 lb

## 2020-10-26 DIAGNOSIS — I4811 Longstanding persistent atrial fibrillation: Secondary | ICD-10-CM

## 2020-10-26 NOTE — Progress Notes (Signed)
Electrophysiology Office Note   Date:  10/26/2020   ID:  Douglas Cowan, DOB 05-17-1946, MRN [id number]  PCP:  Patient, No Pcp Per (Inactive)  Cardiologist:  Izora Ribas Primary Electrophysiologist:  Meggen Spaziani Jorja Loa, MD    Chief Complaint: AF   History of Present Illness: Douglas Cowan is a 75 y.o. male who is being seen today for the evaluation of AF at the request of Noel Journey. Presenting today for electrophysiology evaluation.  He has a history significant for coronary artery disease with stenting in 2011, atrial flutter status post ablation, longstanding persistent atrial fibrillation, diabetes, morbid obesity.  He has a history of longstanding persistent atrial fibrillation.  He had a cardioversion with less shortness of breath and more energy.  He is now status post ablation 07/02/2020.  Today, denies symptoms of palpitations, chest pain, shortness of breath, orthopnea, PND, lower extremity edema, claudication, dizziness, presyncope, syncope, bleeding, or neurologic sequela. The patient is tolerating medications without difficulties.  Since his ablation he has done well.  He has noted no further episodes of atrial fibrillation.  He is able to do all of his daily activities.  He has quite a bit less shortness of breath, chest pain, and fatigue.  He is able to exercise and has been trying to lose weight.   Past Medical History:  Diagnosis Date   Abdominal aortic aneurysm (AAA) (HCC)    Anxiety    Arthritis    BBB (bundle branch block)    CAD (coronary artery disease)    CHF (congestive heart failure) (HCC)    COPD (chronic obstructive pulmonary disease) (HCC)    CPAP (continuous positive airway pressure) dependence    Depression    DM (diabetes mellitus) (HCC)    DOE (dyspnea on exertion)    HLD (hyperlipidemia)    Hypertension    Obesity    Orthopnea    OSA (obstructive sleep apnea)    Sleep apnea    SOB (shortness of breath)    Thyroid disease     Past Surgical History:  Procedure Laterality Date   ATRIAL FIBRILLATION ABLATION N/A 07/26/2020   Procedure: ATRIAL FIBRILLATION ABLATION;  Surgeon: Regan Lemming, MD;  Location: [facility];  Service: Cardiovascular;  Laterality: N/A;   CARDIOVERSION N/A 05/24/2020   Procedure: CARDIOVERSION;  Surgeon: Christell Constant, MD;  Location: [facility]OS[facility];  Service: Cardiovascular;  Laterality: N/A;   CORONARY ANGIOPLASTY WITH STENT PLACEMENT     VASCULAR SURGERY     VASECTOMY       Current Outpatient Medications  Medication Sig Dispense Refill   ACCU-CHEK GUIDE test strip      Accu-Chek Softclix Lancets lancets 1 each 3 (three) times daily.     acetaminophen (TYLENOL) 500 MG tablet Take 500 mg by mouth every 6 (six) hours as needed for moderate pain.     albuterol (VENTOLIN HFA) 108 (90 Base) MCG/ACT inhaler Inhale 2 puffs into the lungs every 6 (six) hours as needed for wheezing or shortness of breath.     Dulaglutide (TRULICITY) 3 MG/0.5ML SOPN Inject 3 mg into the skin once a week.     ELIQUIS 5 MG TABS tablet TAKE 1 TABLET BY MOUTH TWICE A DAY 180 tablet 0   empagliflozin (JARDIANCE) 10 MG TABS tablet Take 1 tablet (10 mg total) by mouth daily before breakfast. 90 tablet 3   fluticasone (FLONASE) 50 MCG/ACT nasal spray Place 2 sprays into both nostrils daily as needed for allergies or rhinitis.  furosemide (LASIX) 20 MG tablet TAKE 1 TABLET BY MOUTH EVERY DAY IN THE MORNING 90 tablet 0   KLOR-CON M10 10 MEQ tablet TAKE 1 TABLET BY MOUTH EVERY DAY 90 tablet 0   levothyroxine (SYNTHROID) 50 MCG tablet TAKE 1 TABLET BY MOUTH EVERY DAY 90 tablet 0   metFORMIN (GLUCOPHAGE) 500 MG tablet TAKE 2 TABLETS BY MOUTH TWICE A DAY AS NEEDED 120 tablet 1   metoprolol (TOPROL-XL) 200 MG 24 hr tablet TAKE 1 TABLET BY MOUTH NIGHTLY 90 tablet 0   Multiple Vitamin (MULTIVITAMIN) capsule Take 1 capsule by mouth daily.     rosuvastatin (CRESTOR) 40 MG tablet TAKE 1 TABLET BY MOUTH EVERY  DAY 90 tablet 0   tamoxifen (NOLVADEX) 10 MG tablet Take 1 tablet (10 mg total) by mouth daily. 90 tablet 3   TIADYLT ER 120 MG 24 hr capsule TAKE 1 CAPSULE BY MOUTH NIGHTLY. 90 capsule 0   tiotropium (SPIRIVA) 18 MCG inhalation capsule Place 18 mcg into inhaler and inhale daily.     No current facility-administered medications for this visit.    Allergies:   Lipitor [atorvastatin]   Social History:  The patient  reports that he has quit smoking. His smoking use included cigarettes and pipe. He has never used smokeless tobacco. He reports current alcohol use of about 20.0 standard drinks of alcohol per week. He reports that he does not use drugs.   Family History:  The patient's family history includes Arthritis in his sister; Birth defects in his brother and sister; Cancer in his father; Depression in his mother; Diabetes in his father; Early death in his brother and father; Heart disease in his brother, mother, and sister; Hyperlipidemia in his mother; Stroke in his mother.   ROS:  Please see the history of present illness.   Otherwise, review of systems is positive for none.   All other systems are reviewed and negative.   PHYSICAL EXAM: VS:  BP 114/70   Pulse (!) 52   Ht 5\' 11"  (1.803 m)   Wt 277 lb (125.6 kg)   SpO2 94%   BMI 38.63 kg/m  , BMI Body mass index is 38.63 kg/m. GEN: Well nourished, well developed, in no acute distress  HEENT: normal  Neck: no JVD, carotid bruits, or masses Cardiac: RRR; no murmurs, rubs, or gallops,no edema  Respiratory:  clear to auscultation bilaterally, normal work of breathing GI: soft, nontender, nondistended, + BS MS: no deformity or atrophy  Skin: warm and dry Neuro:  Strength and sensation are intact Psych: euthymic mood, full affect  EKG:  EKG is ordered today. Personal review of the ekg ordered shows sinus rhythm, rate 52, right bundle branch block  Recent Labs: 04/16/2020: Magnesium 1.9 07/03/2020: TSH 5.69 08/07/2020: ALT 39; B  Natriuretic Peptide 55.1; Hemoglobin 14.2; Platelets 231 09/18/2020: BUN 14; Creatinine, Ser 1.08; NT-Pro BNP 224; Potassium 4.5; Sodium 138    Lipid Panel     Component Value Date/Time   CHOL 135 04/11/2020 0927   TRIG 178.0 (H) 04/11/2020 0927   HDL 41.50 04/11/2020 0927   CHOLHDL 3 04/11/2020 0927   VLDL 35.6 04/11/2020 0927   LDLCALC 58 04/11/2020 0927     Wt Readings from Last 3 Encounters:  10/26/20 277 lb (125.6 kg)  09/18/20 284 lb (128.8 kg)  08/23/20 283 lb 9.6 oz (128.6 kg)      Other studies Reviewed: Additional studies/ records that were reviewed today include: TTE 05/04/20  Review of the above records  today demonstrates:   1. Left ventricular ejection fraction, by estimation, is 50% with beat to  beat variability. The left ventricle has low normal function. Left  ventricular endocardial border not optimally defined to evaluate regional  wall motion despite the use of  Definity. Inferior wall appears akinetic in short axis, inferolateral wall  likely severely hypokinetic. There is mild left ventricular hypertrophy.  Left ventricular diastolic parameters are indeterminate.   2. Right ventricular systolic function is mildly reduced. The right  ventricular size is moderately enlarged. There is borderline mild  pulmonary artery systolic pressure. The estimated right ventricular  systolic pressure is 35.3 mmHg.   3. Left atrial size was mild to moderately dilated.   4. Mitral valve regurgitation likely eccentric and therefore may be  underestimated, with evidence of splay artifact. MR is likely  mild-moderate to moderate. The mitral valve is degenerative. Mild to  moderate mitral valve regurgitation. No evidence of  mitral stenosis. Moderate mitral annular calcification.   5. The aortic valve is grossly normal. There is mild calcification of the  aortic valve. Aortic valve regurgitation is not visualized.   6. The inferior vena cava is normal in size with greater  than 50%  respiratory variability, suggesting right atrial pressure of 3 mmHg.    ASSESSMENT AND PLAN:  1.  Longstanding persistent atrial fibrillation/atrial flutter: Currently on Eliquis and diltiazem.  CHA2DS2-VASc of 4.  He is status post ablation 07/02/2020.  He is remained in sinus rhythm.  He feels quite a bit better in normal rhythm.  We Kobey Sides continue with current management.  2.  Coronary artery disease: No current chest pain.  Plan per primary cardiology.    3.  Chronic systolic heart failure: NYHA class II.  Currently on optimal medical therapy per primary cardiology.    Current medicines are reviewed at length with the patient today.   The patient does not have concerns regarding his medicines.  The following changes were made today: None  Labs/ tests ordered today include:  Orders Placed This Encounter  Procedures   EKG 12-Lead      Disposition:   FU with Imanie Darrow 3 months  Signed, Malayjah Otoole Jorja Loa, MD  10/26/2020 10:06 AM     [facility] [address] [place] [address] [phone] (office) [phone] (fax)

## 2020-10-26 NOTE — Patient Instructions (Signed)
Medication Instructions:  Your physician recommends that you continue on your current medications as directed. Please refer to the Current Medication list given to you today.  *If you need a refill on your cardiac medications before your next appointment, please call your pharmacy*   Lab Work: None ordered   Testing/Procedures: None ordered   Follow-Up: At [facility], you and your health needs are our priority.  As part of our continuing mission to provide you with exceptional heart care, we have created designated Provider Care Teams.  These Care Teams include your primary Cardiologist (physician) and Advanced Practice Providers (APPs -  Physician Assistants and Nurse Practitioners) who all work together to provide you with the care you need, when you need it.  Your next appointment:   3 month(s)  The format for your next appointment:   In Person  Provider:   Will Camnitz, MD    Thank you for choosing [facility]!!   Sondra Blixt, RN [phone]     

## 2020-11-01 ENCOUNTER — Other Ambulatory Visit: Payer: Self-pay | Admitting: Family Medicine

## 2020-11-01 DIAGNOSIS — E039 Hypothyroidism, unspecified: Secondary | ICD-10-CM

## 2020-11-06 NOTE — Telephone Encounter (Signed)
Pt is requesting a refill on these medications. Would Dr. Izora Ribas like to refill these medications? Please address

## 2020-11-07 ENCOUNTER — Other Ambulatory Visit: Payer: Self-pay | Admitting: *Deleted

## 2020-11-07 DIAGNOSIS — I4819 Other persistent atrial fibrillation: Secondary | ICD-10-CM

## 2020-11-07 MED ORDER — POTASSIUM CHLORIDE CRYS ER 10 MEQ PO TBCR: 10.0000 meq | EXTENDED_RELEASE_TABLET | Freq: Every day | ORAL | 3 refills | Status: DC

## 2020-11-07 MED ORDER — ELIQUIS 5 MG PO TABS: 1.0000 | ORAL_TABLET | Freq: Two times a day (BID) | ORAL | 2 refills | Status: AC

## 2020-11-07 MED ORDER — DILTIAZEM HCL ER BEADS 120 MG PO CP24: 120.0000 mg | ORAL_CAPSULE | Freq: Every day | ORAL | 3 refills | Status: DC

## 2020-11-07 MED ORDER — ROSUVASTATIN CALCIUM 40 MG PO TABS: 40.0000 mg | ORAL_TABLET | Freq: Every day | ORAL | 0 refills | Status: AC

## 2020-11-07 MED ORDER — METOPROLOL SUCCINATE ER 200 MG PO TB24: 200.0000 mg | ORAL_TABLET | Freq: Every day | ORAL | 0 refills | Status: DC

## 2020-11-07 NOTE — Telephone Encounter (Signed)
Eliquis 5mg  refill request received. Patient is 75 years old, weight-125.6kg, Crea-1.08 on 09/18/2020, Diagnosis-Afib, and last seen by Dr. 11/18/2020 on 10/26/20 & Dr. 12/26/20 on 09/18/2020. Dose is appropriate based on dosing criteria. Will send in refill to requested pharmacy.

## 2020-11-09 ENCOUNTER — Other Ambulatory Visit: Payer: Self-pay | Admitting: Family Medicine

## 2020-11-09 ENCOUNTER — Other Ambulatory Visit: Payer: Self-pay | Admitting: Internal Medicine

## 2020-11-09 DIAGNOSIS — E039 Hypothyroidism, unspecified: Secondary | ICD-10-CM

## 2020-11-18 NOTE — Addendum Note (Signed)
Encounter addended by: Novella Olive on: 11/18/2020 4:30 PM  Actions taken: Letter saved

## 2020-11-30 ENCOUNTER — Telehealth: Payer: Self-pay | Admitting: Internal Medicine

## 2020-11-30 NOTE — Telephone Encounter (Signed)
Pt aware of the need to repeat CT at  High Resolution  per radiologists re lung findings ./cy

## 2020-11-30 NOTE — Telephone Encounter (Signed)
Need high resolution CT in 3 months for below findings.

## 2020-11-30 NOTE — Telephone Encounter (Signed)
Pt is calling with concerns about the findings of his CT and is requesting a callback

## 2020-12-06 DIAGNOSIS — Z711 Person with feared health complaint in whom no diagnosis is made: Secondary | ICD-10-CM | POA: Diagnosis not present

## 2020-12-06 DIAGNOSIS — Z03818 Encounter for observation for suspected exposure to other biological agents ruled out: Secondary | ICD-10-CM | POA: Diagnosis not present

## 2021-01-07 ENCOUNTER — Ambulatory Visit: Payer: Medicare PPO | Admitting: Endocrinology

## 2021-01-08 DIAGNOSIS — I110 Hypertensive heart disease with heart failure: Secondary | ICD-10-CM | POA: Diagnosis not present

## 2021-01-08 DIAGNOSIS — D6869 Other thrombophilia: Secondary | ICD-10-CM | POA: Diagnosis not present

## 2021-01-08 DIAGNOSIS — J449 Chronic obstructive pulmonary disease, unspecified: Secondary | ICD-10-CM | POA: Diagnosis not present

## 2021-01-08 DIAGNOSIS — I719 Aortic aneurysm of unspecified site, without rupture: Secondary | ICD-10-CM | POA: Diagnosis not present

## 2021-01-08 DIAGNOSIS — I509 Heart failure, unspecified: Secondary | ICD-10-CM | POA: Diagnosis not present

## 2021-01-08 DIAGNOSIS — I4891 Unspecified atrial fibrillation: Secondary | ICD-10-CM | POA: Diagnosis not present

## 2021-01-08 DIAGNOSIS — D6859 Other primary thrombophilia: Secondary | ICD-10-CM | POA: Diagnosis not present

## 2021-01-08 DIAGNOSIS — I25119 Atherosclerotic heart disease of native coronary artery with unspecified angina pectoris: Secondary | ICD-10-CM | POA: Diagnosis not present

## 2021-01-08 DIAGNOSIS — I4892 Unspecified atrial flutter: Secondary | ICD-10-CM | POA: Diagnosis not present

## 2021-01-22 DIAGNOSIS — I152 Hypertension secondary to endocrine disorders: Secondary | ICD-10-CM | POA: Diagnosis not present

## 2021-01-22 DIAGNOSIS — E039 Hypothyroidism, unspecified: Secondary | ICD-10-CM | POA: Diagnosis not present

## 2021-01-22 DIAGNOSIS — E1159 Type 2 diabetes mellitus with other circulatory complications: Secondary | ICD-10-CM | POA: Diagnosis not present

## 2021-01-22 DIAGNOSIS — Z125 Encounter for screening for malignant neoplasm of prostate: Secondary | ICD-10-CM | POA: Diagnosis not present

## 2021-01-22 DIAGNOSIS — Z794 Long term (current) use of insulin: Secondary | ICD-10-CM | POA: Diagnosis not present

## 2021-01-22 DIAGNOSIS — E119 Type 2 diabetes mellitus without complications: Secondary | ICD-10-CM | POA: Diagnosis not present

## 2021-01-22 DIAGNOSIS — E7800 Pure hypercholesterolemia, unspecified: Secondary | ICD-10-CM | POA: Diagnosis not present

## 2021-01-29 ENCOUNTER — Ambulatory Visit: Payer: Medicare PPO | Admitting: Cardiology

## 2021-01-29 DIAGNOSIS — Z23 Encounter for immunization: Secondary | ICD-10-CM | POA: Diagnosis not present

## 2021-01-29 DIAGNOSIS — E1165 Type 2 diabetes mellitus with hyperglycemia: Secondary | ICD-10-CM | POA: Diagnosis not present

## 2021-01-29 DIAGNOSIS — I5032 Chronic diastolic (congestive) heart failure: Secondary | ICD-10-CM | POA: Diagnosis not present

## 2021-01-29 DIAGNOSIS — I714 Abdominal aortic aneurysm, without rupture: Secondary | ICD-10-CM | POA: Diagnosis not present

## 2021-01-29 DIAGNOSIS — E119 Type 2 diabetes mellitus without complications: Secondary | ICD-10-CM | POA: Diagnosis not present

## 2021-01-29 DIAGNOSIS — I4891 Unspecified atrial fibrillation: Secondary | ICD-10-CM | POA: Diagnosis not present

## 2021-01-29 DIAGNOSIS — J449 Chronic obstructive pulmonary disease, unspecified: Secondary | ICD-10-CM | POA: Diagnosis not present

## 2021-01-29 DIAGNOSIS — Z0000 Encounter for general adult medical examination without abnormal findings: Secondary | ICD-10-CM | POA: Diagnosis not present

## 2021-03-07 DIAGNOSIS — E1169 Type 2 diabetes mellitus with other specified complication: Secondary | ICD-10-CM | POA: Diagnosis not present

## 2021-03-07 DIAGNOSIS — I2510 Atherosclerotic heart disease of native coronary artery without angina pectoris: Secondary | ICD-10-CM | POA: Diagnosis not present

## 2021-03-07 DIAGNOSIS — E785 Hyperlipidemia, unspecified: Secondary | ICD-10-CM | POA: Diagnosis not present

## 2021-03-07 DIAGNOSIS — I4891 Unspecified atrial fibrillation: Secondary | ICD-10-CM | POA: Diagnosis not present

## 2021-05-04 ENCOUNTER — Other Ambulatory Visit: Payer: Self-pay | Admitting: Internal Medicine

## 2021-05-06 MED ORDER — DILTIAZEM HCL ER BEADS 120 MG PO CP24
120.0000 mg | ORAL_CAPSULE | Freq: Every day | ORAL | 1 refills | Status: DC
Start: 2021-05-06 — End: 2022-01-16

## 2021-05-24 DIAGNOSIS — I714 Abdominal aortic aneurysm, without rupture, unspecified: Secondary | ICD-10-CM | POA: Diagnosis not present

## 2021-06-11 DIAGNOSIS — R918 Other nonspecific abnormal finding of lung field: Secondary | ICD-10-CM | POA: Diagnosis not present

## 2021-06-11 DIAGNOSIS — Z87891 Personal history of nicotine dependence: Secondary | ICD-10-CM | POA: Diagnosis not present

## 2021-06-11 DIAGNOSIS — G4733 Obstructive sleep apnea (adult) (pediatric): Secondary | ICD-10-CM | POA: Diagnosis not present

## 2021-06-11 DIAGNOSIS — J449 Chronic obstructive pulmonary disease, unspecified: Secondary | ICD-10-CM | POA: Diagnosis not present

## 2021-06-11 DIAGNOSIS — Z9989 Dependence on other enabling machines and devices: Secondary | ICD-10-CM | POA: Diagnosis not present

## 2021-06-14 DIAGNOSIS — I714 Abdominal aortic aneurysm, without rupture, unspecified: Secondary | ICD-10-CM | POA: Diagnosis not present

## 2021-06-14 DIAGNOSIS — I87399 Chronic venous hypertension (idiopathic) with other complications of unspecified lower extremity: Secondary | ICD-10-CM | POA: Diagnosis not present

## 2021-06-14 DIAGNOSIS — I83893 Varicose veins of bilateral lower extremities with other complications: Secondary | ICD-10-CM | POA: Diagnosis not present

## 2021-06-14 DIAGNOSIS — E119 Type 2 diabetes mellitus without complications: Secondary | ICD-10-CM | POA: Diagnosis not present

## 2021-07-25 ENCOUNTER — Other Ambulatory Visit: Payer: Self-pay | Admitting: Internal Medicine

## 2021-07-25 DIAGNOSIS — I4819 Other persistent atrial fibrillation: Secondary | ICD-10-CM

## 2021-07-26 NOTE — Telephone Encounter (Signed)
Pt has moved to [place], has established with new cardiologist Dr Trilby Drummer.  Will refuse refill and notify them to send rx to pt's new cardiologist.  ?

## 2021-07-26 NOTE — Telephone Encounter (Signed)
Called [facility] pharmacy in [place], notifed pharmacy of pt's new cardiologist Dr Irine Seal, they are going to redirect refill request to their office.  Will refuse refill request to our office.  ?

## 2021-08-01 DIAGNOSIS — Z125 Encounter for screening for malignant neoplasm of prostate: Secondary | ICD-10-CM | POA: Diagnosis not present

## 2021-08-01 DIAGNOSIS — I4891 Unspecified atrial fibrillation: Secondary | ICD-10-CM | POA: Diagnosis not present

## 2021-08-01 DIAGNOSIS — Z6841 Body Mass Index (BMI) 40.0 and over, adult: Secondary | ICD-10-CM | POA: Diagnosis not present

## 2021-08-01 DIAGNOSIS — I5032 Chronic diastolic (congestive) heart failure: Secondary | ICD-10-CM | POA: Diagnosis not present

## 2021-08-01 DIAGNOSIS — E1165 Type 2 diabetes mellitus with hyperglycemia: Secondary | ICD-10-CM | POA: Diagnosis not present

## 2021-08-01 DIAGNOSIS — E1169 Type 2 diabetes mellitus with other specified complication: Secondary | ICD-10-CM | POA: Diagnosis not present

## 2021-08-01 DIAGNOSIS — E785 Hyperlipidemia, unspecified: Secondary | ICD-10-CM | POA: Diagnosis not present

## 2021-08-02 ENCOUNTER — Other Ambulatory Visit: Payer: Self-pay | Admitting: Endocrinology

## 2021-08-31 ENCOUNTER — Other Ambulatory Visit: Payer: Self-pay | Admitting: Endocrinology

## 2021-08-31 ENCOUNTER — Other Ambulatory Visit: Payer: Self-pay | Admitting: Internal Medicine

## 2021-09-05 DIAGNOSIS — Z9989 Dependence on other enabling machines and devices: Secondary | ICD-10-CM | POA: Diagnosis not present

## 2021-09-05 DIAGNOSIS — Z8679 Personal history of other diseases of the circulatory system: Secondary | ICD-10-CM | POA: Diagnosis not present

## 2021-09-05 DIAGNOSIS — G4733 Obstructive sleep apnea (adult) (pediatric): Secondary | ICD-10-CM | POA: Diagnosis not present

## 2021-09-05 DIAGNOSIS — J449 Chronic obstructive pulmonary disease, unspecified: Secondary | ICD-10-CM | POA: Diagnosis not present

## 2021-09-05 DIAGNOSIS — R918 Other nonspecific abnormal finding of lung field: Secondary | ICD-10-CM | POA: Diagnosis not present

## 2021-09-20 DIAGNOSIS — Z8679 Personal history of other diseases of the circulatory system: Secondary | ICD-10-CM | POA: Diagnosis not present

## 2021-09-20 DIAGNOSIS — E119 Type 2 diabetes mellitus without complications: Secondary | ICD-10-CM | POA: Diagnosis not present

## 2021-09-20 DIAGNOSIS — I5032 Chronic diastolic (congestive) heart failure: Secondary | ICD-10-CM | POA: Diagnosis not present

## 2021-09-20 DIAGNOSIS — I2510 Atherosclerotic heart disease of native coronary artery without angina pectoris: Secondary | ICD-10-CM | POA: Diagnosis not present

## 2021-09-20 DIAGNOSIS — G4733 Obstructive sleep apnea (adult) (pediatric): Secondary | ICD-10-CM | POA: Diagnosis not present

## 2021-09-20 DIAGNOSIS — I878 Other specified disorders of veins: Secondary | ICD-10-CM | POA: Diagnosis not present

## 2021-09-20 DIAGNOSIS — I714 Abdominal aortic aneurysm, without rupture, unspecified: Secondary | ICD-10-CM | POA: Diagnosis not present

## 2021-09-20 DIAGNOSIS — E7800 Pure hypercholesterolemia, unspecified: Secondary | ICD-10-CM | POA: Diagnosis not present

## 2021-09-20 DIAGNOSIS — I4891 Unspecified atrial fibrillation: Secondary | ICD-10-CM | POA: Diagnosis not present

## 2021-10-03 DIAGNOSIS — Z87891 Personal history of nicotine dependence: Secondary | ICD-10-CM | POA: Diagnosis not present

## 2021-11-07 IMAGING — CT CT HEART MORPH/PULM VEIN W/ CM & W/O CA SCORE
1 series · 2 of 4 positions shown, 3 images · non-contrast
Comparison: None.
COMPARISON: None.

Addendum:
EXAM:
OVER-READ INTERPRETATION  CT CHEST

The following report is an over-read performed by radiologist Dr.
Hong Sheng Chaong [REDACTED] on 07/19/2020. This
over-read does not include interpretation of cardiac or coronary
anatomy or pathology. The coronary calcium score/coronary CTA
interpretation by the cardiologist is attached.
CLINICAL DATA: Atrial fibrillation scheduled for an ablation.
Cardiac CT/CTA
TECHNIQUE: The patient was scanned on a Siemens Somatom scanner.

[Series 987: — · 0.33mm/px · 2 of 4 slices shown, 3 images]
[im 2/4  vessel]
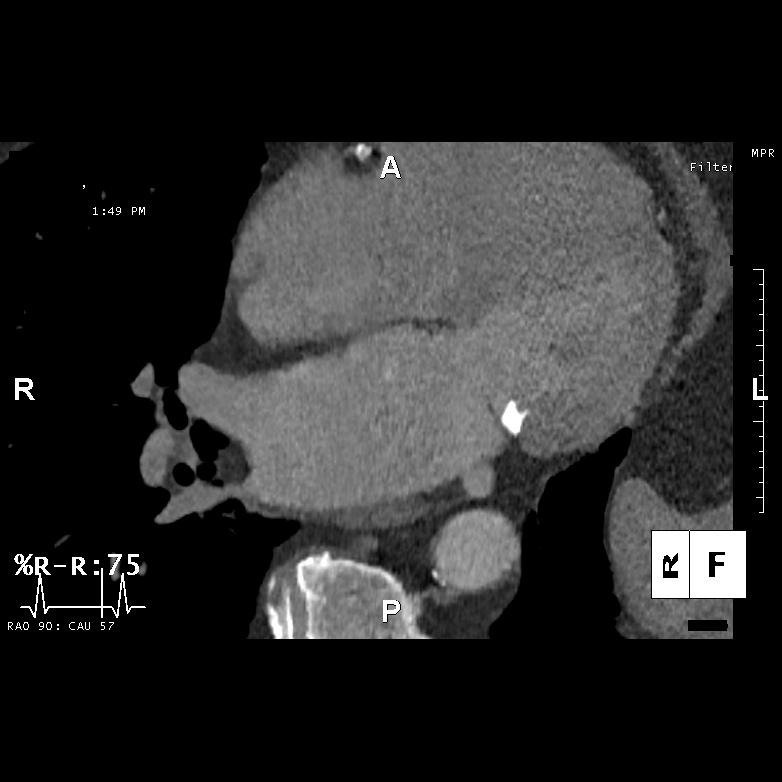
[im 2/4  lung]
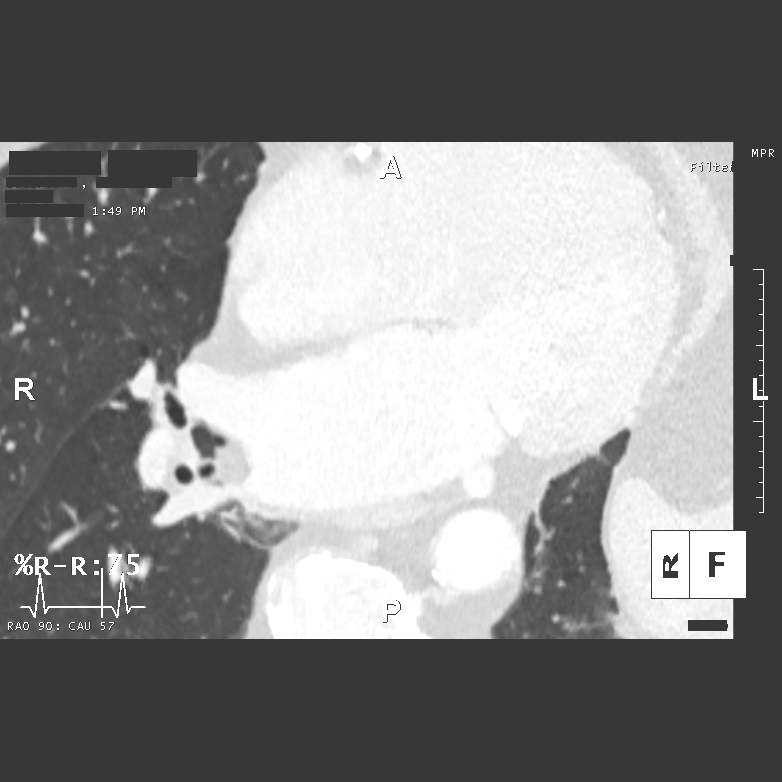
[im 3/4  vessel]
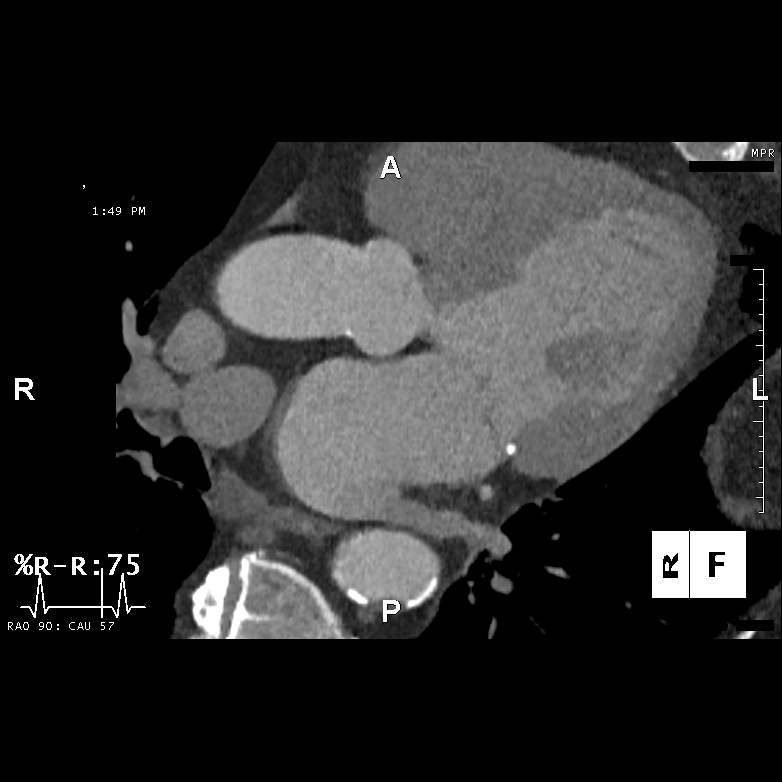

[2 of 4 positions shown; findings below may reference images not displayed]

FINDINGS: Minimal ground-glass attenuation and septal thickening in the
extreme lung bases. Within the visualized portions of the thorax
there are no suspicious appearing pulmonary nodules or masses, there
is no acute consolidative airspace disease, no pleural effusions and
no pneumothorax. Several prominent borderline enlarged and mildly
enlarged right hilar and mediastinal lymph nodes are noted,
measuring up to 1.5 cm in the middle mediastinum adjacent to the
distal third of the esophagus (axial image 23 of series 12).
Visualized portions of the upper abdomen demonstrates some
low-attenuation lesions in the liver which are too small to
characterize, but statistically likely to represent tiny cysts.
There is also diffuse low attenuation throughout the visualized
hepatic parenchyma, indicative of hepatic steatosis. There are no
aggressive appearing lytic or blastic lesions noted in the
visualized portions of the skeleton.
IMPRESSION: 1. Subtle findings in the extreme lung bases which could be
indicative of very early or mild interstitial lung disease. There is
also some borderline enlarged and minimally enlarged mediastinal and
hilar lymph nodes. Follow-up evaluation of all of these findings is
recommended in 3 months with high-resolution chest CT.
2.  Aortic Atherosclerosis (NA5N9-AY1.1).
3. Hepatic steatosis.
FINDINGS: A 120 kV prospective scan was triggered in the descending thoracic
aorta at 111 HU's. Gantry rotation speed was 280 msecs and
collimation was .9 mm. No beta blockade and no NTG was given. The 3D
data set was reconstructed in 5% intervals of the 60-80 % of the R-R
cycle. Diastolic phases were analyzed on a dedicated work station
using MPR, MIP and VRT modes. The patient received 100 cc of
contrast.

There is atypical pulmonary vein drainage into the left atrium (2 on
the right merge to a common ostium and 2 on the left) with ostial
measurements as follows:

RCPV: 25 mm X 22 mm

LUPV: 18 mm X 8 mm

LLPV: 17 mm X 12 mm

The left atrial appendage is large - mixed chicken wing one dominant
lobe and ostial size 39 mm x 25 mm and length 48 mm. There is no
thrombus in the left atrial appendage.

Notable left atrial dilation.

The esophagus runs in the left atrial midline and is not in the
proximity to any of the pulmonary veins.

Aorta:  Normal caliber.  Aortic atherosclerosis noted.

Aortic Valve:  Tri-leaflet.  No calcifications.

Mitral Valve Calcification is prominent.

Coronary Arteries: Normal coronary origin. Right dominance. The
study was performed without use of NTG and insufficient for plaque
evaluation.

Coronary calcium score deferred: known coronary artery disease.

Extra-cardiac findings: See attached radiology report for
non-cardiac structures.
IMPRESSION: 1. There is atypical pulmonary vein drainage into the left atrium (2
on the right merge to a common ostium and 2 on the left).

2. The left atrial appendage is large - mixed chicken wing one
dominant lobe and ostial size 39 mm x 25 mm and length 48 mm mm.
There is no thrombus in the left atrial appendage.

3. The esophagus runs in the left atrial midline and is not in the
proximity to any of the pulmonary veins.

4. Notable left atrial dilation- sub-optimal filling for 3D modeling
likely related to this dilation.

5. Aortic atherosclerosis noted.

6. Mitral Valve Calcification is prominent.

*** End of Addendum ***
EXAM:
OVER-READ INTERPRETATION  CT CHEST

The following report is an over-read performed by radiologist Dr.
Hong Sheng Chaong [REDACTED] on 07/19/2020. This
over-read does not include interpretation of cardiac or coronary
anatomy or pathology. The coronary calcium score/coronary CTA
interpretation by the cardiologist is attached.
FINDINGS: Minimal ground-glass attenuation and septal thickening in the
extreme lung bases. Within the visualized portions of the thorax
there are no suspicious appearing pulmonary nodules or masses, there
is no acute consolidative airspace disease, no pleural effusions and
no pneumothorax. Several prominent borderline enlarged and mildly
enlarged right hilar and mediastinal lymph nodes are noted,
measuring up to 1.5 cm in the middle mediastinum adjacent to the
distal third of the esophagus (axial image 23 of series 12).
Visualized portions of the upper abdomen demonstrates some
low-attenuation lesions in the liver which are too small to
characterize, but statistically likely to represent tiny cysts.
There is also diffuse low attenuation throughout the visualized
hepatic parenchyma, indicative of hepatic steatosis. There are no
aggressive appearing lytic or blastic lesions noted in the
visualized portions of the skeleton.
IMPRESSION: 1. Subtle findings in the extreme lung bases which could be
indicative of very early or mild interstitial lung disease. There is
also some borderline enlarged and minimally enlarged mediastinal and
hilar lymph nodes. Follow-up evaluation of all of these findings is
recommended in 3 months with high-resolution chest CT.
2.  Aortic Atherosclerosis (NA5N9-AY1.1).
3. Hepatic steatosis.

## 2021-11-12 DIAGNOSIS — I152 Hypertension secondary to endocrine disorders: Secondary | ICD-10-CM | POA: Diagnosis not present

## 2021-11-12 DIAGNOSIS — I5032 Chronic diastolic (congestive) heart failure: Secondary | ICD-10-CM | POA: Diagnosis not present

## 2021-11-12 DIAGNOSIS — I484 Atypical atrial flutter: Secondary | ICD-10-CM | POA: Diagnosis not present

## 2021-11-12 DIAGNOSIS — E1159 Type 2 diabetes mellitus with other circulatory complications: Secondary | ICD-10-CM | POA: Diagnosis not present

## 2021-11-12 DIAGNOSIS — I4891 Unspecified atrial fibrillation: Secondary | ICD-10-CM | POA: Diagnosis not present

## 2021-11-20 DIAGNOSIS — M25551 Pain in right hip: Secondary | ICD-10-CM | POA: Diagnosis not present

## 2021-11-20 DIAGNOSIS — Z6841 Body Mass Index (BMI) 40.0 and over, adult: Secondary | ICD-10-CM | POA: Diagnosis not present

## 2021-11-20 DIAGNOSIS — M7061 Trochanteric bursitis, right hip: Secondary | ICD-10-CM | POA: Diagnosis not present

## 2021-11-22 ENCOUNTER — Other Ambulatory Visit: Payer: Self-pay | Admitting: Internal Medicine

## 2021-11-22 MED ORDER — POTASSIUM CHLORIDE CRYS ER 10 MEQ PO TBCR: 10.0000 meq | EXTENDED_RELEASE_TABLET | Freq: Every day | ORAL | 0 refills | Status: DC

## 2021-11-26 IMAGING — DX DG CHEST 2V
2 series · 2 of 2 positions shown · non-contrast
Comparison: 07/19/2020 coronary CTA

CLINICAL DATA: Shortness of breath. Chest congestion with cough for
5 days.

EXAM:
CHEST - 2 VIEW

[chest pa]
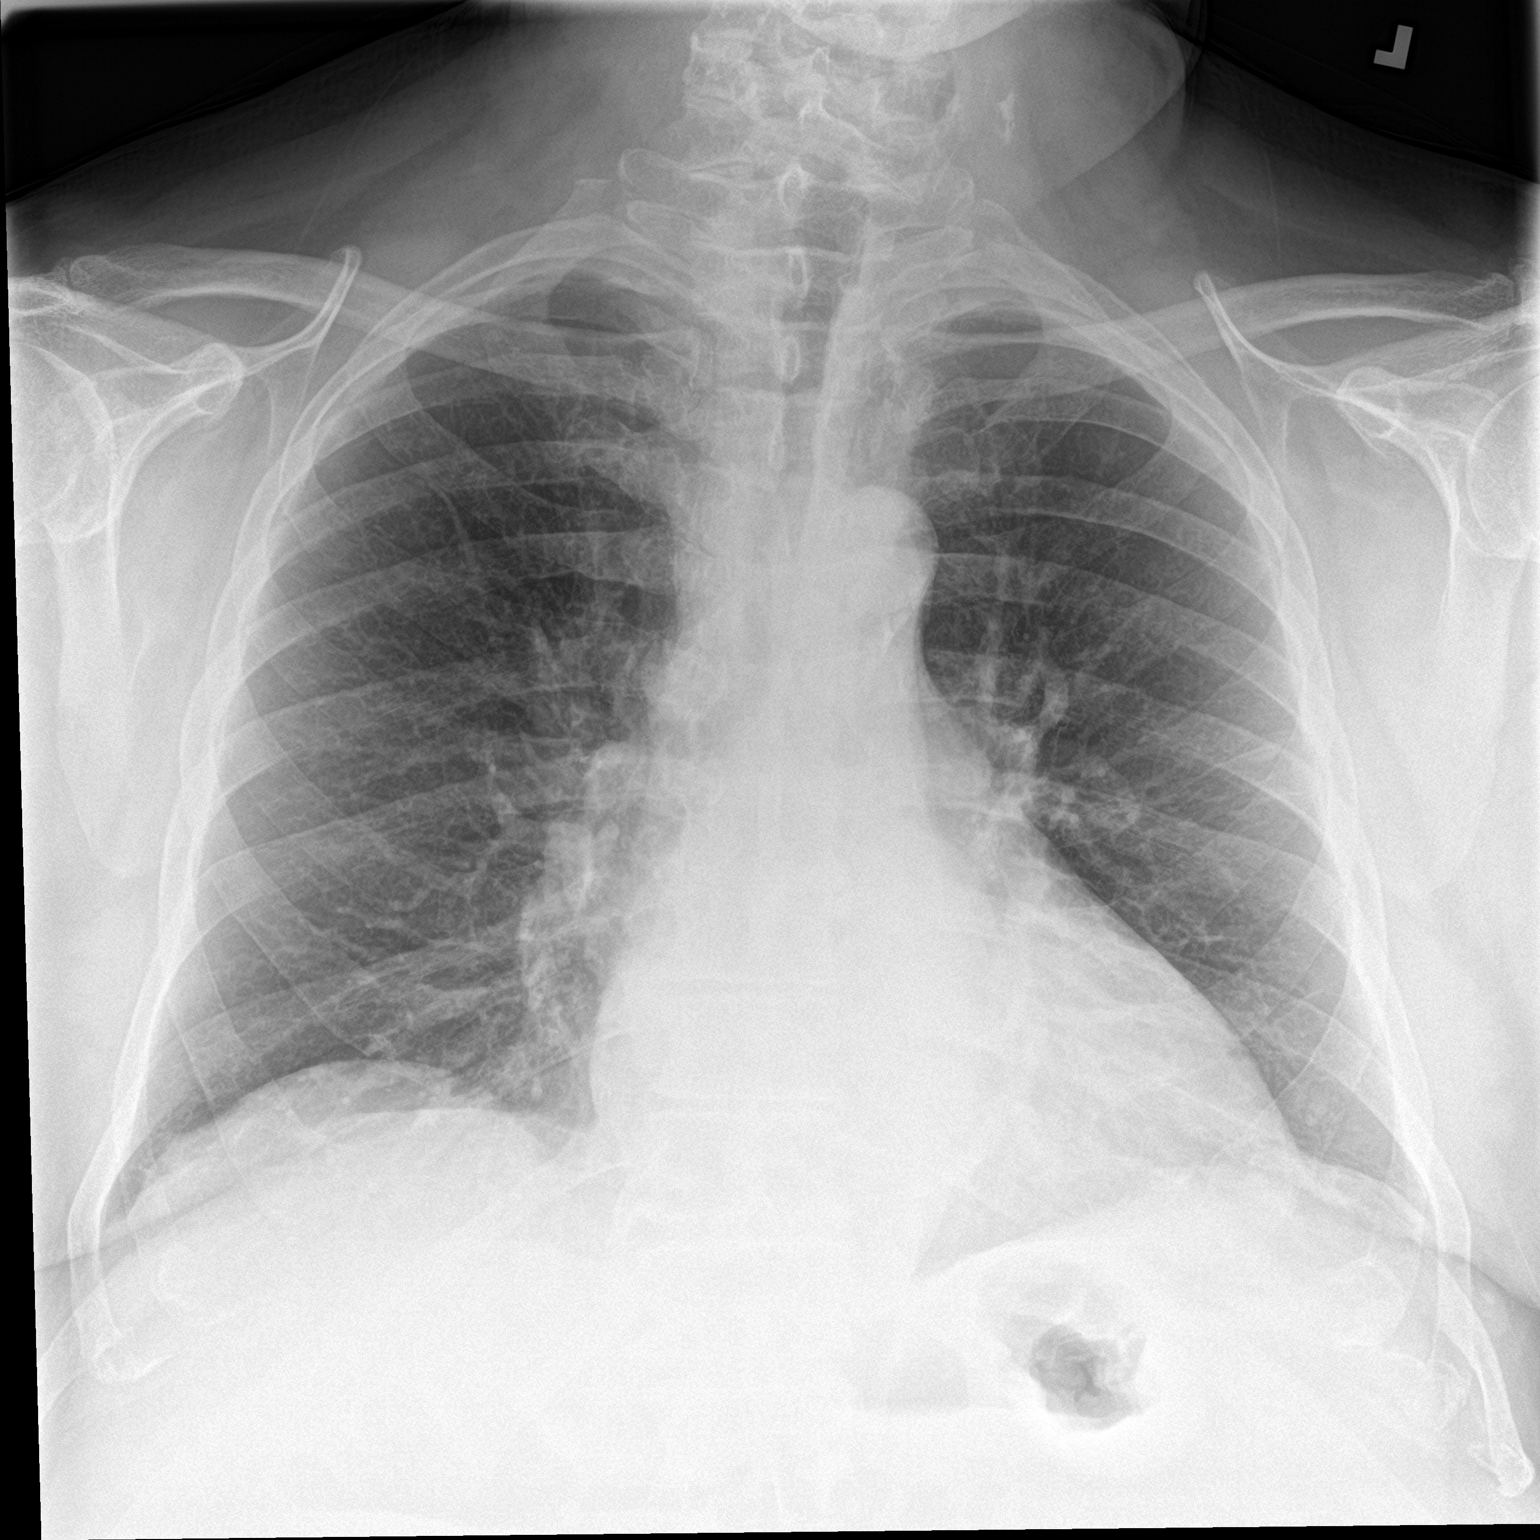

[chest lat]
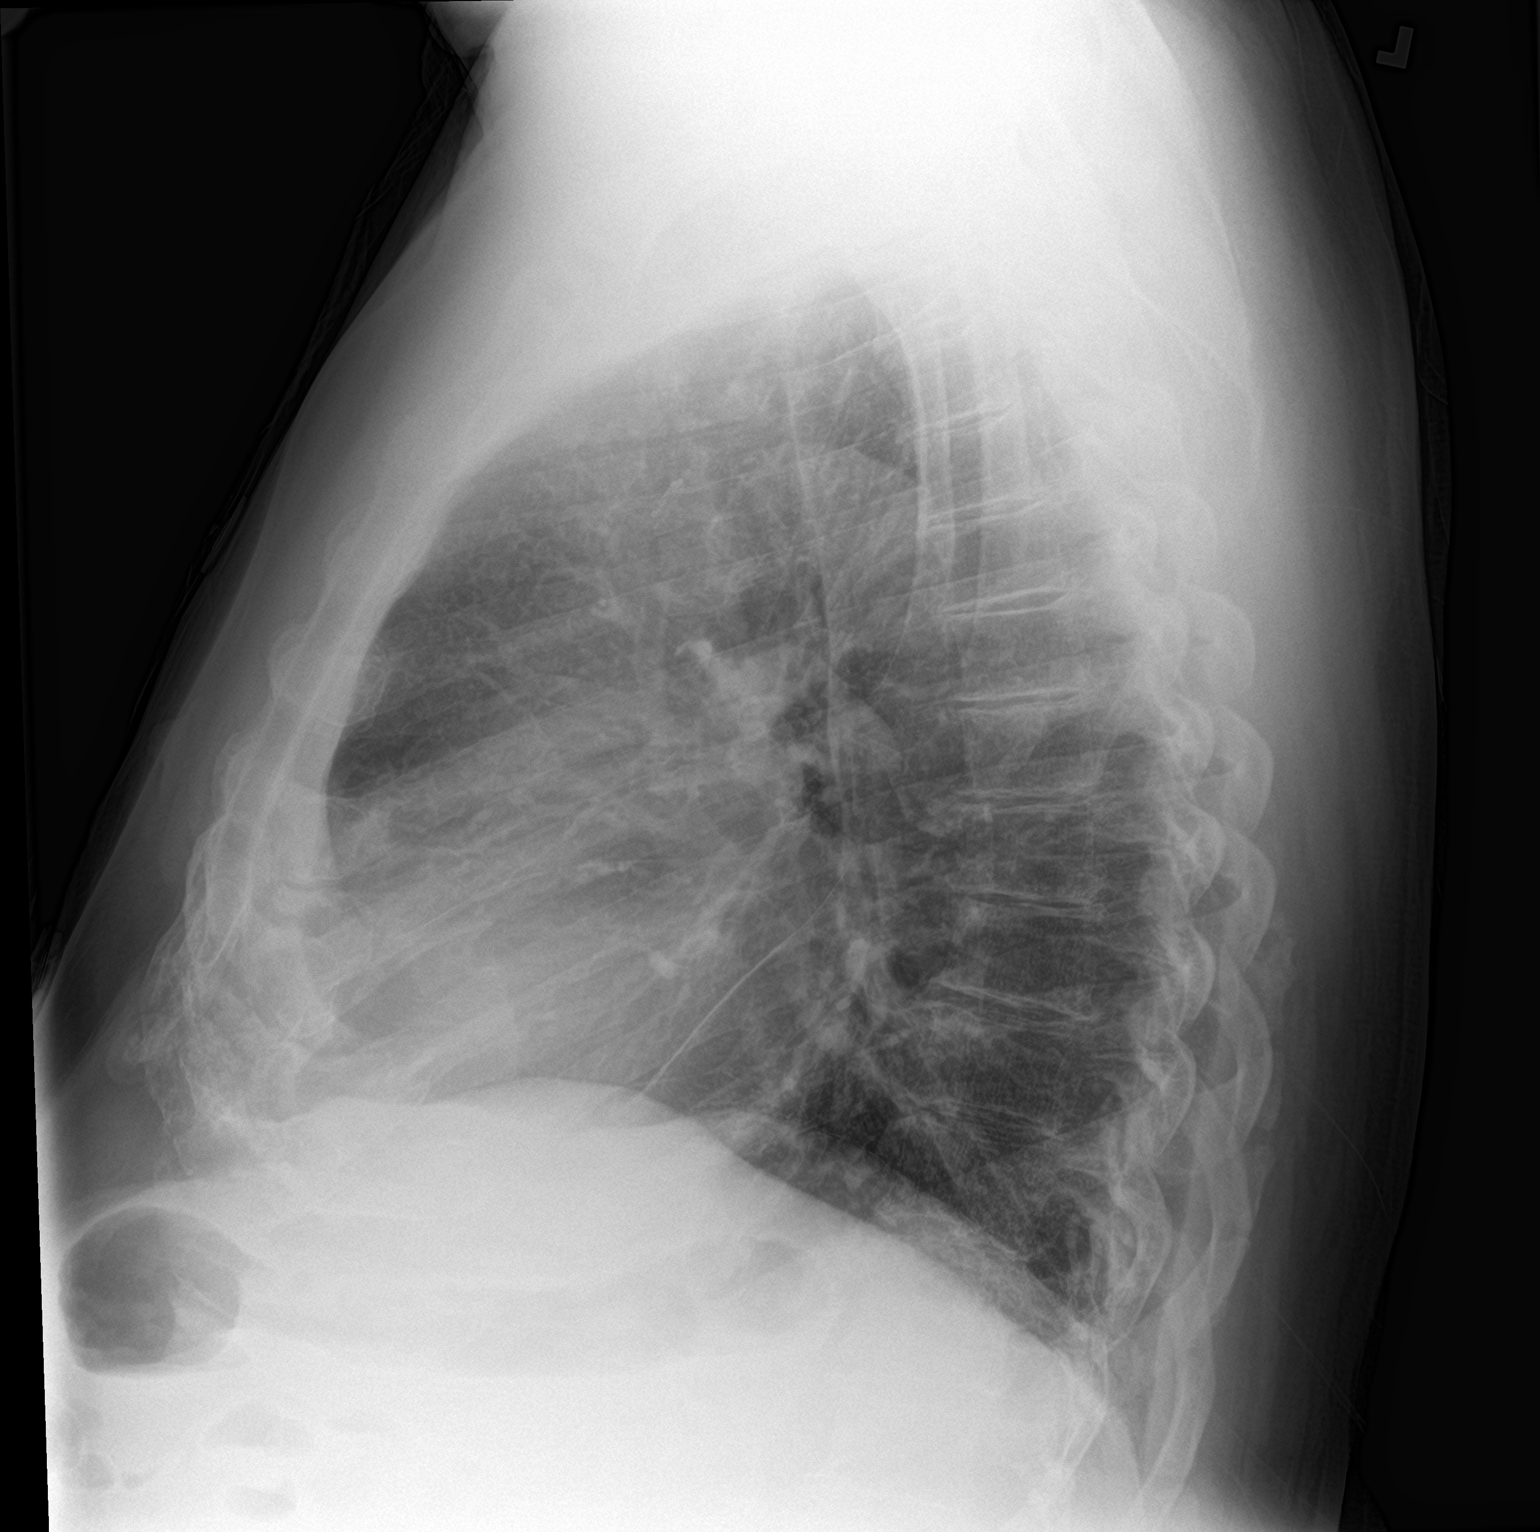

[2 of 2 positions shown; findings below may reference images not displayed]

FINDINGS: Midline trachea. Mild cardiomegaly. Atherosclerosis in the
transverse aorta. No pleural effusion or pneumothorax. Clear lungs.
No congestive failure.
IMPRESSION: Cardiomegaly without congestive failure.

Aortic Atherosclerosis ([id number]-6F1.1).

## 2021-11-29 DIAGNOSIS — Z1211 Encounter for screening for malignant neoplasm of colon: Secondary | ICD-10-CM | POA: Diagnosis not present

## 2021-11-29 DIAGNOSIS — Z1212 Encounter for screening for malignant neoplasm of rectum: Secondary | ICD-10-CM | POA: Diagnosis not present

## 2021-12-05 DIAGNOSIS — I4891 Unspecified atrial fibrillation: Secondary | ICD-10-CM | POA: Diagnosis not present

## 2021-12-18 DIAGNOSIS — Z87891 Personal history of nicotine dependence: Secondary | ICD-10-CM | POA: Diagnosis not present

## 2021-12-18 DIAGNOSIS — G4733 Obstructive sleep apnea (adult) (pediatric): Secondary | ICD-10-CM | POA: Diagnosis not present

## 2021-12-18 DIAGNOSIS — R918 Other nonspecific abnormal finding of lung field: Secondary | ICD-10-CM | POA: Diagnosis not present

## 2021-12-18 DIAGNOSIS — J449 Chronic obstructive pulmonary disease, unspecified: Secondary | ICD-10-CM | POA: Diagnosis not present

## 2021-12-18 DIAGNOSIS — Z801 Family history of malignant neoplasm of trachea, bronchus and lung: Secondary | ICD-10-CM | POA: Diagnosis not present

## 2021-12-26 ENCOUNTER — Other Ambulatory Visit: Payer: Self-pay | Admitting: Internal Medicine

## 2021-12-26 MED ORDER — POTASSIUM CHLORIDE CRYS ER 10 MEQ PO TBCR: 10.0000 meq | EXTENDED_RELEASE_TABLET | Freq: Every day | ORAL | 0 refills | Status: AC

## 2022-01-02 DIAGNOSIS — I83893 Varicose veins of bilateral lower extremities with other complications: Secondary | ICD-10-CM | POA: Diagnosis not present

## 2022-01-02 DIAGNOSIS — L97929 Non-pressure chronic ulcer of unspecified part of left lower leg with unspecified severity: Secondary | ICD-10-CM | POA: Diagnosis not present

## 2022-01-02 DIAGNOSIS — I714 Abdominal aortic aneurysm, without rupture, unspecified: Secondary | ICD-10-CM | POA: Diagnosis not present

## 2022-01-15 ENCOUNTER — Other Ambulatory Visit: Payer: Self-pay | Admitting: Internal Medicine

## 2022-01-16 ENCOUNTER — Other Ambulatory Visit: Payer: Self-pay | Admitting: Internal Medicine

## 2022-01-16 MED ORDER — DILTIAZEM HCL ER BEADS 120 MG PO CP24: 120.0000 mg | ORAL_CAPSULE | Freq: Every day | ORAL | 0 refills | Status: DC

## 2022-01-30 DIAGNOSIS — E7800 Pure hypercholesterolemia, unspecified: Secondary | ICD-10-CM | POA: Diagnosis not present

## 2022-01-30 DIAGNOSIS — E119 Type 2 diabetes mellitus without complications: Secondary | ICD-10-CM | POA: Diagnosis not present

## 2022-01-30 DIAGNOSIS — E785 Hyperlipidemia, unspecified: Secondary | ICD-10-CM | POA: Diagnosis not present

## 2022-01-30 DIAGNOSIS — E1169 Type 2 diabetes mellitus with other specified complication: Secondary | ICD-10-CM | POA: Diagnosis not present

## 2022-01-30 DIAGNOSIS — E039 Hypothyroidism, unspecified: Secondary | ICD-10-CM | POA: Diagnosis not present

## 2022-01-30 DIAGNOSIS — Z125 Encounter for screening for malignant neoplasm of prostate: Secondary | ICD-10-CM | POA: Diagnosis not present

## 2022-01-30 DIAGNOSIS — Z794 Long term (current) use of insulin: Secondary | ICD-10-CM | POA: Diagnosis not present

## 2022-01-31 DIAGNOSIS — Z7901 Long term (current) use of anticoagulants: Secondary | ICD-10-CM | POA: Diagnosis not present

## 2022-01-31 DIAGNOSIS — I83893 Varicose veins of bilateral lower extremities with other complications: Secondary | ICD-10-CM | POA: Diagnosis not present

## 2022-02-15 ENCOUNTER — Other Ambulatory Visit: Payer: Self-pay | Admitting: Internal Medicine

## 2022-02-17 MED ORDER — DILTIAZEM HCL ER BEADS 120 MG PO CP24: 120.0000 mg | ORAL_CAPSULE | Freq: Every day | ORAL | 0 refills | Status: AC

## 2022-03-04 DIAGNOSIS — I4891 Unspecified atrial fibrillation: Secondary | ICD-10-CM | POA: Diagnosis not present

## 2022-03-04 DIAGNOSIS — Z0000 Encounter for general adult medical examination without abnormal findings: Secondary | ICD-10-CM | POA: Diagnosis not present

## 2022-03-04 DIAGNOSIS — Z0100 Encounter for examination of eyes and vision without abnormal findings: Secondary | ICD-10-CM | POA: Diagnosis not present

## 2022-03-04 DIAGNOSIS — E1165 Type 2 diabetes mellitus with hyperglycemia: Secondary | ICD-10-CM | POA: Diagnosis not present

## 2022-03-04 DIAGNOSIS — I714 Abdominal aortic aneurysm, without rupture, unspecified: Secondary | ICD-10-CM | POA: Diagnosis not present

## 2022-03-04 DIAGNOSIS — Z794 Long term (current) use of insulin: Secondary | ICD-10-CM | POA: Diagnosis not present

## 2022-03-04 DIAGNOSIS — G4709 Other insomnia: Secondary | ICD-10-CM | POA: Diagnosis not present

## 2022-03-04 DIAGNOSIS — Z6838 Body mass index (BMI) 38.0-38.9, adult: Secondary | ICD-10-CM | POA: Diagnosis not present

## 2022-03-21 ENCOUNTER — Telehealth: Payer: Self-pay | Admitting: *Deleted

## 2022-03-21 NOTE — Patient Outreach (Signed)
  Care Coordination   03/21/2022 Name: Douglas Cowan MRN: [id number] DOB: 09-27-1945   Care Coordination Outreach Attempts:  An unsuccessful telephone outreach was attempted today to offer the patient information about available care coordination services as a benefit of their health plan.   Follow Up Plan:  Additional outreach attempts will be made to offer the patient care coordination information and services.   Encounter Outcome:  No Answer  Care Coordination Interventions Activated:  No   Care Coordination Interventions:  No, not indicated    Eduard Clos MSW, LCSW Licensed Clinical Social Worker      [phone]

## 2022-03-28 DIAGNOSIS — I83891 Varicose veins of right lower extremities with other complications: Secondary | ICD-10-CM | POA: Diagnosis not present

## 2022-04-04 DIAGNOSIS — I83891 Varicose veins of right lower extremities with other complications: Secondary | ICD-10-CM | POA: Diagnosis not present

## 2022-04-04 DIAGNOSIS — I83892 Varicose veins of left lower extremities with other complications: Secondary | ICD-10-CM | POA: Diagnosis not present

## 2022-04-14 DIAGNOSIS — I83892 Varicose veins of left lower extremities with other complications: Secondary | ICD-10-CM | POA: Diagnosis not present

## 2022-04-14 DIAGNOSIS — I83893 Varicose veins of bilateral lower extremities with other complications: Secondary | ICD-10-CM | POA: Diagnosis not present

## 2022-04-18 DIAGNOSIS — I83892 Varicose veins of left lower extremities with other complications: Secondary | ICD-10-CM | POA: Diagnosis not present

## 2022-04-18 DIAGNOSIS — I83893 Varicose veins of bilateral lower extremities with other complications: Secondary | ICD-10-CM | POA: Diagnosis not present

## 2022-04-19 DIAGNOSIS — R059 Cough, unspecified: Secondary | ICD-10-CM | POA: Diagnosis not present

## 2022-04-19 DIAGNOSIS — E079 Disorder of thyroid, unspecified: Secondary | ICD-10-CM | POA: Diagnosis not present

## 2022-04-19 DIAGNOSIS — I2510 Atherosclerotic heart disease of native coronary artery without angina pectoris: Secondary | ICD-10-CM | POA: Diagnosis not present

## 2022-04-19 DIAGNOSIS — I4891 Unspecified atrial fibrillation: Secondary | ICD-10-CM | POA: Diagnosis not present

## 2022-04-19 DIAGNOSIS — R509 Fever, unspecified: Secondary | ICD-10-CM | POA: Diagnosis not present

## 2022-04-19 DIAGNOSIS — I10 Essential (primary) hypertension: Secondary | ICD-10-CM | POA: Diagnosis not present

## 2022-04-19 DIAGNOSIS — E119 Type 2 diabetes mellitus without complications: Secondary | ICD-10-CM | POA: Diagnosis not present

## 2022-04-19 DIAGNOSIS — E785 Hyperlipidemia, unspecified: Secondary | ICD-10-CM | POA: Diagnosis not present

## 2022-04-19 DIAGNOSIS — L03116 Cellulitis of left lower limb: Secondary | ICD-10-CM | POA: Diagnosis not present

## 2022-04-21 DIAGNOSIS — Z6837 Body mass index (BMI) 37.0-37.9, adult: Secondary | ICD-10-CM | POA: Diagnosis not present

## 2022-04-21 DIAGNOSIS — I83893 Varicose veins of bilateral lower extremities with other complications: Secondary | ICD-10-CM | POA: Diagnosis not present

## 2022-04-24 DIAGNOSIS — I83893 Varicose veins of bilateral lower extremities with other complications: Secondary | ICD-10-CM | POA: Diagnosis not present

## 2022-04-24 DIAGNOSIS — Z6837 Body mass index (BMI) 37.0-37.9, adult: Secondary | ICD-10-CM | POA: Diagnosis not present

## 2022-05-21 DIAGNOSIS — I152 Hypertension secondary to endocrine disorders: Secondary | ICD-10-CM | POA: Diagnosis not present

## 2022-05-21 DIAGNOSIS — N528 Other male erectile dysfunction: Secondary | ICD-10-CM | POA: Diagnosis not present

## 2022-05-21 DIAGNOSIS — Z6841 Body Mass Index (BMI) 40.0 and over, adult: Secondary | ICD-10-CM | POA: Diagnosis not present

## 2022-05-21 DIAGNOSIS — Z794 Long term (current) use of insulin: Secondary | ICD-10-CM | POA: Diagnosis not present

## 2022-05-21 DIAGNOSIS — E119 Type 2 diabetes mellitus without complications: Secondary | ICD-10-CM | POA: Diagnosis not present

## 2022-06-12 ENCOUNTER — Telehealth: Payer: Self-pay

## 2022-06-12 NOTE — Patient Outreach (Signed)
  Care Coordination   06/12/2022 Name: Douglas Cowan MRN: [id number] DOB: Feb 07, 1946   Care Coordination Outreach Attempts:  A second unsuccessful outreach was attempted today to offer the patient with information about available care coordination services as a benefit of their health plan.     Follow Up Plan:  Additional outreach attempts will be made to offer the patient care coordination information and services.   Encounter Outcome:  No Answer   Care Coordination Interventions:  No, not indicated    Tomasa Rand, RN, BSN, [facility] [employer] [phone]

## 2022-06-17 ENCOUNTER — Telehealth: Payer: Self-pay

## 2022-06-17 NOTE — Patient Outreach (Signed)
  Care Coordination   06/17/2022 Name: Douglas Cowan MRN: [id number] DOB: 09-24-1945   Care Coordination Outreach Attempts:  A third unsuccessful outreach was attempted today to offer the patient with information about available care coordination services as a benefit of their health plan.   Follow Up Plan:  No further outreach attempts will be made at this time. We have been unable to contact the patient to offer or enroll patient in care coordination services  Encounter Outcome:  No Answer   Care Coordination Interventions:  No, not indicated    Tomasa Rand, RN, BSN, [facility] [place] Coordinator [phone]

## 2022-06-26 ENCOUNTER — Encounter (HOSPITAL_COMMUNITY): Payer: Self-pay | Admitting: *Deleted

## 2022-08-12 DIAGNOSIS — H35371 Puckering of macula, right eye: Secondary | ICD-10-CM | POA: Diagnosis not present

## 2022-08-19 DIAGNOSIS — Z6841 Body Mass Index (BMI) 40.0 and over, adult: Secondary | ICD-10-CM | POA: Diagnosis not present

## 2022-08-19 DIAGNOSIS — M7062 Trochanteric bursitis, left hip: Secondary | ICD-10-CM | POA: Diagnosis not present

## 2022-08-19 DIAGNOSIS — M7061 Trochanteric bursitis, right hip: Secondary | ICD-10-CM | POA: Diagnosis not present

## 2022-09-02 DIAGNOSIS — Z6841 Body Mass Index (BMI) 40.0 and over, adult: Secondary | ICD-10-CM | POA: Diagnosis not present

## 2022-09-02 DIAGNOSIS — M19011 Primary osteoarthritis, right shoulder: Secondary | ICD-10-CM | POA: Diagnosis not present

## 2022-09-02 DIAGNOSIS — M25511 Pain in right shoulder: Secondary | ICD-10-CM | POA: Diagnosis not present

## 2022-09-24 DIAGNOSIS — E1136 Type 2 diabetes mellitus with diabetic cataract: Secondary | ICD-10-CM | POA: Diagnosis not present

## 2022-09-24 DIAGNOSIS — Z809 Family history of malignant neoplasm, unspecified: Secondary | ICD-10-CM | POA: Diagnosis not present

## 2022-09-24 DIAGNOSIS — I110 Hypertensive heart disease with heart failure: Secondary | ICD-10-CM | POA: Diagnosis not present

## 2022-09-24 DIAGNOSIS — Z7984 Long term (current) use of oral hypoglycemic drugs: Secondary | ICD-10-CM | POA: Diagnosis not present

## 2022-09-24 DIAGNOSIS — G4700 Insomnia, unspecified: Secondary | ICD-10-CM | POA: Diagnosis not present

## 2022-09-24 DIAGNOSIS — N529 Male erectile dysfunction, unspecified: Secondary | ICD-10-CM | POA: Diagnosis not present

## 2022-09-24 DIAGNOSIS — Z87891 Personal history of nicotine dependence: Secondary | ICD-10-CM | POA: Diagnosis not present

## 2022-09-24 DIAGNOSIS — J309 Allergic rhinitis, unspecified: Secondary | ICD-10-CM | POA: Diagnosis not present

## 2022-09-24 DIAGNOSIS — Z8249 Family history of ischemic heart disease and other diseases of the circulatory system: Secondary | ICD-10-CM | POA: Diagnosis not present

## 2022-09-24 DIAGNOSIS — I509 Heart failure, unspecified: Secondary | ICD-10-CM | POA: Diagnosis not present

## 2022-09-24 DIAGNOSIS — Z7901 Long term (current) use of anticoagulants: Secondary | ICD-10-CM | POA: Diagnosis not present

## 2022-09-24 DIAGNOSIS — E039 Hypothyroidism, unspecified: Secondary | ICD-10-CM | POA: Diagnosis not present

## 2022-09-24 DIAGNOSIS — G4733 Obstructive sleep apnea (adult) (pediatric): Secondary | ICD-10-CM | POA: Diagnosis not present

## 2022-09-24 DIAGNOSIS — M1990 Unspecified osteoarthritis, unspecified site: Secondary | ICD-10-CM | POA: Diagnosis not present

## 2022-09-24 DIAGNOSIS — E785 Hyperlipidemia, unspecified: Secondary | ICD-10-CM | POA: Diagnosis not present

## 2022-09-24 DIAGNOSIS — K08409 Partial loss of teeth, unspecified cause, unspecified class: Secondary | ICD-10-CM | POA: Diagnosis not present

## 2022-09-24 DIAGNOSIS — R32 Unspecified urinary incontinence: Secondary | ICD-10-CM | POA: Diagnosis not present

## 2022-12-19 DIAGNOSIS — Z87891 Personal history of nicotine dependence: Secondary | ICD-10-CM | POA: Diagnosis not present

## 2022-12-19 DIAGNOSIS — Z122 Encounter for screening for malignant neoplasm of respiratory organs: Secondary | ICD-10-CM | POA: Diagnosis not present

## 2023-01-02 DIAGNOSIS — Z87891 Personal history of nicotine dependence: Secondary | ICD-10-CM | POA: Diagnosis not present

## 2023-01-02 DIAGNOSIS — J449 Chronic obstructive pulmonary disease, unspecified: Secondary | ICD-10-CM | POA: Diagnosis not present

## 2023-03-17 DIAGNOSIS — Z6838 Body mass index (BMI) 38.0-38.9, adult: Secondary | ICD-10-CM | POA: Diagnosis not present

## 2023-03-17 DIAGNOSIS — E119 Type 2 diabetes mellitus without complications: Secondary | ICD-10-CM | POA: Diagnosis not present

## 2023-03-17 DIAGNOSIS — M7989 Other specified soft tissue disorders: Secondary | ICD-10-CM | POA: Diagnosis not present

## 2023-03-17 DIAGNOSIS — Z794 Long term (current) use of insulin: Secondary | ICD-10-CM | POA: Diagnosis not present

## 2023-03-17 DIAGNOSIS — I878 Other specified disorders of veins: Secondary | ICD-10-CM | POA: Diagnosis not present

## 2023-03-20 DIAGNOSIS — Z23 Encounter for immunization: Secondary | ICD-10-CM | POA: Diagnosis not present

## 2023-03-20 DIAGNOSIS — I152 Hypertension secondary to endocrine disorders: Secondary | ICD-10-CM | POA: Diagnosis not present

## 2023-03-20 DIAGNOSIS — I5032 Chronic diastolic (congestive) heart failure: Secondary | ICD-10-CM | POA: Diagnosis not present

## 2023-03-20 DIAGNOSIS — M7989 Other specified soft tissue disorders: Secondary | ICD-10-CM | POA: Diagnosis not present

## 2023-03-20 DIAGNOSIS — I4891 Unspecified atrial fibrillation: Secondary | ICD-10-CM | POA: Diagnosis not present

## 2023-03-20 DIAGNOSIS — E1159 Type 2 diabetes mellitus with other circulatory complications: Secondary | ICD-10-CM | POA: Diagnosis not present

## 2023-03-20 DIAGNOSIS — I714 Abdominal aortic aneurysm, without rupture, unspecified: Secondary | ICD-10-CM | POA: Diagnosis not present

## 2023-05-07 DIAGNOSIS — M79642 Pain in left hand: Secondary | ICD-10-CM | POA: Diagnosis not present

## 2023-05-07 DIAGNOSIS — M6530 Trigger finger, unspecified finger: Secondary | ICD-10-CM | POA: Diagnosis not present

## 2023-08-10 DIAGNOSIS — Z0000 Encounter for general adult medical examination without abnormal findings: Secondary | ICD-10-CM | POA: Diagnosis not present

## 2023-08-10 DIAGNOSIS — E1159 Type 2 diabetes mellitus with other circulatory complications: Secondary | ICD-10-CM | POA: Diagnosis not present

## 2023-08-10 DIAGNOSIS — E039 Hypothyroidism, unspecified: Secondary | ICD-10-CM | POA: Diagnosis not present

## 2023-08-10 DIAGNOSIS — E785 Hyperlipidemia, unspecified: Secondary | ICD-10-CM | POA: Diagnosis not present

## 2023-08-10 DIAGNOSIS — F321 Major depressive disorder, single episode, moderate: Secondary | ICD-10-CM | POA: Diagnosis not present

## 2023-08-10 DIAGNOSIS — Z125 Encounter for screening for malignant neoplasm of prostate: Secondary | ICD-10-CM | POA: Diagnosis not present

## 2023-08-10 DIAGNOSIS — Z794 Long term (current) use of insulin: Secondary | ICD-10-CM | POA: Diagnosis not present

## 2023-08-10 DIAGNOSIS — E1165 Type 2 diabetes mellitus with hyperglycemia: Secondary | ICD-10-CM | POA: Diagnosis not present

## 2023-08-10 DIAGNOSIS — I152 Hypertension secondary to endocrine disorders: Secondary | ICD-10-CM | POA: Diagnosis not present

## 2023-08-29 DIAGNOSIS — H35371 Puckering of macula, right eye: Secondary | ICD-10-CM | POA: Diagnosis not present

## 2023-12-10 DIAGNOSIS — I152 Hypertension secondary to endocrine disorders: Secondary | ICD-10-CM | POA: Diagnosis not present

## 2023-12-10 DIAGNOSIS — R058 Other specified cough: Secondary | ICD-10-CM | POA: Diagnosis not present

## 2023-12-10 DIAGNOSIS — R059 Cough, unspecified: Secondary | ICD-10-CM | POA: Diagnosis not present

## 2023-12-10 DIAGNOSIS — E1159 Type 2 diabetes mellitus with other circulatory complications: Secondary | ICD-10-CM | POA: Diagnosis not present

## 2023-12-21 DIAGNOSIS — Z122 Encounter for screening for malignant neoplasm of respiratory organs: Secondary | ICD-10-CM | POA: Diagnosis not present

## 2023-12-21 DIAGNOSIS — Z87891 Personal history of nicotine dependence: Secondary | ICD-10-CM | POA: Diagnosis not present

## 2024-02-08 DIAGNOSIS — I152 Hypertension secondary to endocrine disorders: Secondary | ICD-10-CM | POA: Diagnosis not present

## 2024-02-08 DIAGNOSIS — R0602 Shortness of breath: Secondary | ICD-10-CM | POA: Diagnosis not present

## 2024-02-08 DIAGNOSIS — G4733 Obstructive sleep apnea (adult) (pediatric): Secondary | ICD-10-CM | POA: Diagnosis not present

## 2024-02-08 DIAGNOSIS — E1159 Type 2 diabetes mellitus with other circulatory complications: Secondary | ICD-10-CM | POA: Diagnosis not present

## 2024-02-08 DIAGNOSIS — J432 Centrilobular emphysema: Secondary | ICD-10-CM | POA: Diagnosis not present

## 2024-02-08 DIAGNOSIS — Z8679 Personal history of other diseases of the circulatory system: Secondary | ICD-10-CM | POA: Diagnosis not present

## 2024-02-11 DIAGNOSIS — I4811 Longstanding persistent atrial fibrillation: Secondary | ICD-10-CM | POA: Diagnosis not present

## 2024-02-11 DIAGNOSIS — E1165 Type 2 diabetes mellitus with hyperglycemia: Secondary | ICD-10-CM | POA: Diagnosis not present

## 2024-02-11 DIAGNOSIS — E785 Hyperlipidemia, unspecified: Secondary | ICD-10-CM | POA: Diagnosis not present

## 2024-02-11 DIAGNOSIS — E7800 Pure hypercholesterolemia, unspecified: Secondary | ICD-10-CM | POA: Diagnosis not present

## 2024-02-11 DIAGNOSIS — E1159 Type 2 diabetes mellitus with other circulatory complications: Secondary | ICD-10-CM | POA: Diagnosis not present

## 2024-02-11 DIAGNOSIS — I152 Hypertension secondary to endocrine disorders: Secondary | ICD-10-CM | POA: Diagnosis not present

## 2024-02-11 DIAGNOSIS — E1169 Type 2 diabetes mellitus with other specified complication: Secondary | ICD-10-CM | POA: Diagnosis not present

## 2024-02-11 DIAGNOSIS — Z23 Encounter for immunization: Secondary | ICD-10-CM | POA: Diagnosis not present

## 2024-02-11 DIAGNOSIS — Z794 Long term (current) use of insulin: Secondary | ICD-10-CM | POA: Diagnosis not present

## 2024-02-12 DIAGNOSIS — I38 Endocarditis, valve unspecified: Secondary | ICD-10-CM | POA: Diagnosis not present

## 2024-02-12 DIAGNOSIS — J449 Chronic obstructive pulmonary disease, unspecified: Secondary | ICD-10-CM | POA: Diagnosis not present

## 2024-02-12 DIAGNOSIS — I25118 Atherosclerotic heart disease of native coronary artery with other forms of angina pectoris: Secondary | ICD-10-CM | POA: Diagnosis not present

## 2024-02-12 DIAGNOSIS — I5042 Chronic combined systolic (congestive) and diastolic (congestive) heart failure: Secondary | ICD-10-CM | POA: Diagnosis not present

## 2024-02-12 DIAGNOSIS — E1169 Type 2 diabetes mellitus with other specified complication: Secondary | ICD-10-CM | POA: Diagnosis not present

## 2024-02-12 DIAGNOSIS — I4891 Unspecified atrial fibrillation: Secondary | ICD-10-CM | POA: Diagnosis not present

## 2024-02-12 DIAGNOSIS — I4892 Unspecified atrial flutter: Secondary | ICD-10-CM | POA: Diagnosis not present

## 2024-02-12 DIAGNOSIS — E1159 Type 2 diabetes mellitus with other circulatory complications: Secondary | ICD-10-CM | POA: Diagnosis not present

## 2024-02-12 DIAGNOSIS — I152 Hypertension secondary to endocrine disorders: Secondary | ICD-10-CM | POA: Diagnosis not present

## 2024-02-22 DIAGNOSIS — E1169 Type 2 diabetes mellitus with other specified complication: Secondary | ICD-10-CM | POA: Diagnosis not present

## 2024-02-22 DIAGNOSIS — I152 Hypertension secondary to endocrine disorders: Secondary | ICD-10-CM | POA: Diagnosis not present

## 2024-02-22 DIAGNOSIS — I4891 Unspecified atrial fibrillation: Secondary | ICD-10-CM | POA: Diagnosis not present

## 2024-02-22 DIAGNOSIS — E785 Hyperlipidemia, unspecified: Secondary | ICD-10-CM | POA: Diagnosis not present

## 2024-02-22 DIAGNOSIS — I4892 Unspecified atrial flutter: Secondary | ICD-10-CM | POA: Diagnosis not present

## 2024-02-22 DIAGNOSIS — I25118 Atherosclerotic heart disease of native coronary artery with other forms of angina pectoris: Secondary | ICD-10-CM | POA: Diagnosis not present

## 2024-02-22 DIAGNOSIS — I5020 Unspecified systolic (congestive) heart failure: Secondary | ICD-10-CM | POA: Diagnosis not present

## 2024-02-22 DIAGNOSIS — J449 Chronic obstructive pulmonary disease, unspecified: Secondary | ICD-10-CM | POA: Diagnosis not present

## 2024-02-22 DIAGNOSIS — E1159 Type 2 diabetes mellitus with other circulatory complications: Secondary | ICD-10-CM | POA: Diagnosis not present

## 2024-03-25 DIAGNOSIS — I5020 Unspecified systolic (congestive) heart failure: Secondary | ICD-10-CM | POA: Diagnosis not present

## 2024-04-04 DIAGNOSIS — I959 Hypotension, unspecified: Secondary | ICD-10-CM | POA: Diagnosis not present

## 2024-04-04 DIAGNOSIS — I25118 Atherosclerotic heart disease of native coronary artery with other forms of angina pectoris: Secondary | ICD-10-CM | POA: Diagnosis not present

## 2024-04-04 DIAGNOSIS — I152 Hypertension secondary to endocrine disorders: Secondary | ICD-10-CM | POA: Diagnosis not present

## 2024-04-04 DIAGNOSIS — J449 Chronic obstructive pulmonary disease, unspecified: Secondary | ICD-10-CM | POA: Diagnosis not present

## 2024-04-04 DIAGNOSIS — I4891 Unspecified atrial fibrillation: Secondary | ICD-10-CM | POA: Diagnosis not present

## 2024-04-04 DIAGNOSIS — I5020 Unspecified systolic (congestive) heart failure: Secondary | ICD-10-CM | POA: Diagnosis not present

## 2024-04-04 DIAGNOSIS — I38 Endocarditis, valve unspecified: Secondary | ICD-10-CM | POA: Diagnosis not present

## 2024-04-04 DIAGNOSIS — I5042 Chronic combined systolic (congestive) and diastolic (congestive) heart failure: Secondary | ICD-10-CM | POA: Diagnosis not present

## 2024-04-04 DIAGNOSIS — E1159 Type 2 diabetes mellitus with other circulatory complications: Secondary | ICD-10-CM | POA: Diagnosis not present
